# Patient Record
Sex: Female | Born: 1940 | Race: White | Hispanic: No | Smoking: Never smoker
Health system: Southern US, Community
[De-identification: ages and names within clinical notes are randomized; demographics above are authoritative.]

## PROBLEM LIST (undated history)

## (undated) DIAGNOSIS — E119 Type 2 diabetes mellitus without complications: Secondary | ICD-10-CM

## (undated) DIAGNOSIS — I10 Essential (primary) hypertension: Secondary | ICD-10-CM

## (undated) DIAGNOSIS — G459 Transient cerebral ischemic attack, unspecified: Secondary | ICD-10-CM

---

## 1999-03-24 ENCOUNTER — Other Ambulatory Visit: Admission: RE | Admit: 1999-03-24 | Discharge: 1999-03-24 | Payer: Self-pay | Admitting: Gynecology

## 1999-10-30 ENCOUNTER — Encounter: Payer: Self-pay | Admitting: Endocrinology

## 1999-10-30 ENCOUNTER — Ambulatory Visit (HOSPITAL_COMMUNITY): Admission: RE | Admit: 1999-10-30 | Discharge: 1999-10-30 | Payer: Self-pay | Admitting: Internal Medicine

## 2000-06-06 ENCOUNTER — Emergency Department (HOSPITAL_COMMUNITY): Admission: EM | Admit: 2000-06-06 | Discharge: 2000-06-06 | Payer: Self-pay | Admitting: Internal Medicine

## 2000-06-06 ENCOUNTER — Encounter: Payer: Self-pay | Admitting: Internal Medicine

## 2000-09-09 ENCOUNTER — Other Ambulatory Visit: Admission: RE | Admit: 2000-09-09 | Discharge: 2000-09-09 | Payer: Self-pay | Admitting: Gynecology

## 2001-11-22 ENCOUNTER — Other Ambulatory Visit: Admission: RE | Admit: 2001-11-22 | Discharge: 2001-11-22 | Payer: Self-pay | Admitting: Gynecology

## 2002-02-17 ENCOUNTER — Encounter: Admission: RE | Admit: 2002-02-17 | Discharge: 2002-02-17 | Payer: Self-pay | Admitting: Endocrinology

## 2002-02-17 ENCOUNTER — Encounter: Payer: Self-pay | Admitting: Endocrinology

## 2003-07-19 ENCOUNTER — Other Ambulatory Visit: Admission: RE | Admit: 2003-07-19 | Discharge: 2003-07-19 | Payer: Self-pay | Admitting: Gynecology

## 2004-07-21 ENCOUNTER — Other Ambulatory Visit: Admission: RE | Admit: 2004-07-21 | Discharge: 2004-07-21 | Payer: Self-pay | Admitting: Gynecology

## 2008-07-19 ENCOUNTER — Encounter: Admission: RE | Admit: 2008-07-19 | Discharge: 2008-07-19 | Payer: Self-pay | Admitting: Endocrinology

## 2010-03-17 ENCOUNTER — Encounter: Admission: RE | Admit: 2010-03-17 | Discharge: 2010-03-17 | Payer: Self-pay | Admitting: Gynecology

## 2010-04-04 ENCOUNTER — Ambulatory Visit (HOSPITAL_COMMUNITY)
Admission: RE | Admit: 2010-04-04 | Discharge: 2010-04-04 | Payer: Self-pay | Source: Home / Self Care | Admitting: Gynecology

## 2010-07-15 LAB — CBC
HCT: 36.7 % (ref 36.0–46.0)
Hemoglobin: 12.4 g/dL (ref 12.0–15.0)
MCH: 31.7 pg (ref 26.0–34.0)
MCHC: 33.8 g/dL (ref 30.0–36.0)
MCV: 93.8 fL (ref 78.0–100.0)
Platelets: 260 10*3/uL (ref 150–400)
RBC: 3.91 MIL/uL (ref 3.87–5.11)
RDW: 13.6 % (ref 11.5–15.5)
WBC: 6.4 10*3/uL (ref 4.0–10.5)

## 2010-07-15 LAB — BASIC METABOLIC PANEL
BUN: 11 mg/dL (ref 6–23)
Chloride: 101 mEq/L (ref 96–112)
Potassium: 4.4 mEq/L (ref 3.5–5.1)

## 2010-09-22 ENCOUNTER — Other Ambulatory Visit: Payer: Self-pay | Admitting: Endocrinology

## 2010-09-22 DIAGNOSIS — R1011 Right upper quadrant pain: Secondary | ICD-10-CM

## 2010-09-23 ENCOUNTER — Ambulatory Visit
Admission: RE | Admit: 2010-09-23 | Discharge: 2010-09-23 | Disposition: A | Discharge status: Home / Self Care | Payer: Medicare Other | Source: Ambulatory Visit | Attending: Endocrinology | Admitting: Endocrinology

## 2010-09-23 DIAGNOSIS — R1011 Right upper quadrant pain: Secondary | ICD-10-CM

## 2011-11-26 ENCOUNTER — Other Ambulatory Visit: Payer: Self-pay

## 2011-12-01 ENCOUNTER — Other Ambulatory Visit: Payer: Self-pay | Admitting: Gynecology

## 2011-12-01 DIAGNOSIS — R928 Other abnormal and inconclusive findings on diagnostic imaging of breast: Secondary | ICD-10-CM

## 2011-12-07 ENCOUNTER — Ambulatory Visit
Admission: RE | Admit: 2011-12-07 | Discharge: 2011-12-07 | Disposition: A | Discharge status: Home / Self Care | Payer: Medicare Other | Source: Ambulatory Visit | Attending: Gynecology | Admitting: Gynecology

## 2011-12-07 DIAGNOSIS — R928 Other abnormal and inconclusive findings on diagnostic imaging of breast: Secondary | ICD-10-CM

## 2012-01-11 ENCOUNTER — Other Ambulatory Visit: Payer: Self-pay | Admitting: Dermatology

## 2013-05-07 ENCOUNTER — Emergency Department (HOSPITAL_COMMUNITY)
Admission: EM | Admit: 2013-05-07 | Discharge: 2013-05-07 | Disposition: A | Discharge status: Home / Self Care | Payer: Medicare Other | Attending: Emergency Medicine | Admitting: Emergency Medicine

## 2013-05-07 ENCOUNTER — Emergency Department (HOSPITAL_COMMUNITY): Payer: Medicare Other

## 2013-05-07 ENCOUNTER — Encounter (HOSPITAL_COMMUNITY): Payer: Self-pay | Admitting: Emergency Medicine

## 2013-05-07 DIAGNOSIS — I10 Essential (primary) hypertension: Secondary | ICD-10-CM | POA: Insufficient documentation

## 2013-05-07 DIAGNOSIS — Y929 Unspecified place or not applicable: Secondary | ICD-10-CM | POA: Diagnosis not present

## 2013-05-07 DIAGNOSIS — S0993XA Unspecified injury of face, initial encounter: Secondary | ICD-10-CM | POA: Diagnosis present

## 2013-05-07 DIAGNOSIS — W010XXA Fall on same level from slipping, tripping and stumbling without subsequent striking against object, initial encounter: Secondary | ICD-10-CM | POA: Diagnosis not present

## 2013-05-07 DIAGNOSIS — S0003XA Contusion of scalp, initial encounter: Secondary | ICD-10-CM | POA: Diagnosis not present

## 2013-05-07 DIAGNOSIS — S1093XA Contusion of unspecified part of neck, initial encounter: Secondary | ICD-10-CM

## 2013-05-07 DIAGNOSIS — Y9301 Activity, walking, marching and hiking: Secondary | ICD-10-CM | POA: Diagnosis not present

## 2013-05-07 DIAGNOSIS — S43016A Anterior dislocation of unspecified humerus, initial encounter: Secondary | ICD-10-CM | POA: Diagnosis not present

## 2013-05-07 DIAGNOSIS — S43005A Unspecified dislocation of left shoulder joint, initial encounter: Secondary | ICD-10-CM

## 2013-05-07 DIAGNOSIS — S0083XA Contusion of other part of head, initial encounter: Secondary | ICD-10-CM

## 2013-05-07 DIAGNOSIS — Z8673 Personal history of transient ischemic attack (TIA), and cerebral infarction without residual deficits: Secondary | ICD-10-CM | POA: Diagnosis not present

## 2013-05-07 HISTORY — DX: Essential (primary) hypertension: I10

## 2013-05-07 HISTORY — DX: Transient cerebral ischemic attack, unspecified: G45.9

## 2013-05-07 MED ORDER — MORPHINE SULFATE 4 MG/ML IJ SOLN
4.0000 mg | Freq: Once | INTRAMUSCULAR | Status: AC
  Administered 2013-05-07: 4 mg via INTRAVENOUS
  Filled 2013-05-07: qty 1

## 2013-05-07 MED ORDER — HYDROCODONE-ACETAMINOPHEN 5-325 MG PO TABS: 1.0000 | ORAL_TABLET | ORAL | Status: DC | PRN

## 2013-05-07 MED ORDER — ONDANSETRON HCL 4 MG/2ML IJ SOLN
4.0000 mg | Freq: Once | INTRAMUSCULAR | Status: AC | PRN
  Administered 2013-05-07: 4 mg via INTRAVENOUS
  Filled 2013-05-07: qty 2

## 2013-05-07 MED ORDER — PROPOFOL 10 MG/ML IV BOLUS
INTRAVENOUS | Status: AC | PRN
  Administered 2013-05-07: 48.8 mg via INTRAVENOUS

## 2013-05-07 MED ORDER — PROPOFOL 10 MG/ML IV BOLUS
0.5000 mg/kg | INTRAVENOUS | Status: DC | PRN
  Filled 2013-05-07: qty 1

## 2013-05-07 MED ORDER — SODIUM CHLORIDE 0.9 % IV SOLN
Freq: Once | INTRAVENOUS | Status: AC
  Administered 2013-05-07: 19:00:00 via INTRAVENOUS

## 2013-05-07 NOTE — ED Notes (Signed)
Pt tripped while walking onto carpet.  Pt hit her head on the arm of the couch.  No loss of LOC.  Left eyebrow swelling with small abrasion to left cheek.  Left hand and left shoulder pain.  Denies any headache, neck or back pain.

## 2013-05-07 NOTE — ED Provider Notes (Signed)
CSN: [id number]     Arrival date & time 05/07/13  1525 History   First MD Initiated Contact with Patient 05/07/13 1537     Chief Complaint  Patient presents with  . Fall    HPI Pt was walking and tripped on a carpet falling onto her shoulder and head.   She is having pain primarily in her left shoulder.  She has a little bit of pain in her head but no where as severe as her left arm.  No LOC.  No vomting.  Still able to move fingers and toes.  Pain in her left upper arm is severe and increases with movement.  No anticoagulant medications.  Past Medical History  Diagnosis Date  . Hypertension   . TIA (transient ischemic attack)    No past surgical history on file. No family history on file. History  Substance Use Topics  . Smoking status: Never Smoker   . Smokeless tobacco: Not on file  . Alcohol Use: No   OB History   Grav Para Term Preterm Abortions TAB SAB Ect Mult Living                 Review of Systems  All other systems reviewed and are negative.    Allergies  Review of patient's allergies indicates no known allergies.  Home Medications   Current Outpatient Rx  Name  Route  Sig  Dispense  Refill  . escitalopram (LEXAPRO) 10 MG tablet   Oral   Take 10 mg by mouth daily.         Marland Kitchen ibuprofen (ADVIL,MOTRIN) 200 MG tablet   Oral   Take 200 mg by mouth every 6 (six) hours as needed for moderate pain.         Marland Kitchen LORazepam (ATIVAN) 1 MG tablet   Oral   Take 1 mg by mouth every 8 (eight) hours.         Marland Kitchen olmesartan-hydrochlorothiazide (BENICAR HCT) 40-25 MG per tablet   Oral   Take 1 tablet by mouth daily.         Marland Kitchen HYDROcodone-acetaminophen (NORCO/VICODIN) 5-325 MG per tablet   Oral   Take 1-2 tablets by mouth every 4 (four) hours as needed.   30 tablet   0    BP 150/79  Pulse 82  Temp(Src) 97.9 F (36.6 C) (Oral)  Resp 15  Ht 5\' 6"  (1.676 m)  Wt 215 lb (97.523 kg)  BMI 34.72 kg/m2  SpO2 98% Physical Exam  Nursing note and vitals  reviewed. Constitutional: She appears well-developed and well-nourished. No distress.  HENT:  Head: Normocephalic.  Right Ear: External ear normal.  Left Ear: External ear normal.  Hematoma above right eyebrow, small abrasion as well  Eyes: Conjunctivae are normal. Right eye exhibits no discharge. Left eye exhibits no discharge. No scleral icterus.  No subconjunctival hemorrhage, normal pupils, no hyphema  Neck: Neck supple. No tracheal deviation present.  Cardiovascular: Normal rate, regular rhythm and intact distal pulses.   Pulmonary/Chest: Effort normal and breath sounds normal. No stridor. No respiratory distress. She has no wheezes. She has no rales.  Abdominal: Soft. Bowel sounds are normal. She exhibits no distension. There is no tenderness. There is no rebound and no guarding.  Musculoskeletal: She exhibits no edema.       Left shoulder: She exhibits decreased range of motion, tenderness and bony tenderness. She exhibits normal pulse and normal strength.       Left elbow: She exhibits normal  range of motion, no swelling, no effusion, no deformity and no laceration. Tenderness (mild) found.  Neurological: She is alert. She has normal strength. No sensory deficit. Cranial nerve deficit:  no gross defecits noted. She exhibits normal muscle tone. She displays no seizure activity. Coordination normal.  Skin: Skin is warm and dry. No rash noted.  Psychiatric: She has a normal mood and affect.    ED Course  Procedural sedation Date/Time: 05/07/2013 7:43 PM Performed by: Linwood DibblesKNAPP, Terique Kawabata R Authorized by: Linwood DibblesKNAPP, Jadene Stemmer R Consent: Verbal consent obtained. Risks and benefits: risks, benefits and alternatives were discussed Consent given by: patient Required items: required blood products, implants, devices, and special equipment available Preparation: Patient was prepped and draped in the usual sterile fashion. Local anesthesia used: no Patient sedated: yes Sedatives: propofol Vitals: Vital  signs were monitored during sedation. Comments: See nursing notes for start and stop times.  Total time approximately 20 minutes  Reduction of dislocation Date/Time: 05/07/2013 7:53 PM Performed by: Linwood DibblesKNAPP, Murry Khiev R Authorized by: Linwood DibblesKNAPP, Zhaniya Swallows R Consent: Verbal consent obtained. Risks and benefits: risks, benefits and alternatives were discussed Time out: Immediately prior to procedure a "time out" was called to verify the correct patient, procedure, equipment, support staff and site/side marked as required. Patient sedated: yes Patient tolerance: Patient tolerated the procedure well with no immediate complications. Comments: Traction counter traction.   Post reduction films with successful reduction   (including critical care time) 1730  Attempted cunningham technique without relief.  Will proceed with sedation and reduction. Labs Review Labs Reviewed - No data to display Imaging Review Dg Elbow 2 Views Left  05/07/2013   CLINICAL DATA:  Larey SeatFell.  Left elbow pain.  EXAM: LEFT ELBOW - 2 VIEW  COMPARISON:  None.  FINDINGS: Limited two-view elbow does not demonstrate any definite acute fracture or joint effusion.  IMPRESSION: Limited examination without obvious fracture or effusion.   Electronically Signed   By: Loralie ChampagneMark  Gallerani M.D.   On: 05/07/2013 16:51   Ct Head Wo Contrast  05/07/2013   CLINICAL DATA:  Larey SeatFell hit head, left eyebrow swelling  EXAM: CT HEAD WITHOUT CONTRAST  TECHNIQUE: Contiguous axial images were obtained from the base of the skull through the vertex without intravenous contrast.  COMPARISON:  07/19/2008  FINDINGS: Left frontal scalp hematoma. No skull fracture. No hemorrhage, infarct, or extra-axial fluid. No hydrocephalus. 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 It is stable.  IMPRESSION: No acute intracranial abnormalities.  Left scalp hematoma.   Electronically Signed   By: Esperanza Heiraymond  Rubner M.D.   On: 05/07/2013 16:57   Dg  Shoulder Left  05/07/2013   CLINICAL DATA:  Larey SeatFell.  Injured shoulder.  EXAM: LEFT SHOULDER - 2+ VIEW  COMPARISON:  None.  FINDINGS: There is an anterior dislocation of the humeral head. No acute fracture. [facility] demonstrates moderate degenerative changes and there is a large per projecting off the undersurface of the acromion. The left lung apex is clear.  IMPRESSION: Anterior dislocation of the humeral head.   Electronically Signed   By: Loralie ChampagneMark  Gallerani M.D.   On: 05/07/2013 16:49   Dg Shoulder Left Port  05/07/2013   CLINICAL DATA:  Status post left shoulder reduction  EXAM: PORTABLE LEFT SHOULDER - 2+ VIEW  COMPARISON:  None.  FINDINGS: There is no shoulder fracture or dislocation. There are degenerative changes of the acromioclavicular joint. There are mild degenerative changes of the left glenohumeral joint.  IMPRESSION:  No acute osseous injury of the left shoulder.   Electronically Signed   By: Elige Ko   On: 05/07/2013 19:10    Medications  propofol (DIPRIVAN) 10 mg/mL bolus/IV push 48.8 mg (not administered)  morphine 4 MG/ML injection 4 mg (4 mg Intravenous Given 05/07/13 1613)  ondansetron (ZOFRAN) injection 4 mg (4 mg Intravenous Given 05/07/13 1609)  morphine 4 MG/ML injection 4 mg (4 mg Intravenous Given 05/07/13 1805)  propofol (DIPRIVAN) 10 mg/mL bolus/IV push ( Intravenous Stopped 05/07/13 1846)  0.9 %  sodium chloride infusion ( Intravenous Stopped 05/07/13 1946)    MDM   1. Facial contusion, initial encounter   2. Shoulder dislocation, left, initial encounter    Pt tolerated well.  No sign of serious injury.  Will dc home with pain meds, ortho follow up.  Shoulder placed in a sling.   Celene Kras, MD 05/07/13 [phone]

## 2013-05-07 NOTE — Discharge Instructions (Signed)
Shoulder Dislocation ° Shoulder dislocation is when your upper arm bone (humerus) is forced out of your shoulder joint. Your doctor will put your shoulder back into the joint by pulling on your arm or through surgery. Your arm will be placed in a shoulder immobilizer or sling. The shoulder immobilizer or sling holds your shoulder in place while it heals. °HOME CARE  °· Rest your injured joint. Do not move it until instructed to do so. °· Put ice on your injured joint as told by your doctor. °· Put ice in a plastic bag. °· Place a towel between your skin and the bag. °· Leave the ice on for 15-20 minutes at a time, every 2 hours while you are awake. °· Only take medicines as told by your doctor. °· Squeeze a ball to exercise your hand. °GET HELP RIGHT AWAY IF:  °· Your splint or sling becomes damaged. °· Your pain becomes worse, not better. °· You lose feeling in your arm or hand. °· Your arm or hand becomes white or cold. °MAKE SURE YOU:  °· Understand these instructions. °· Will watch your condition. °· Will get help right away if you are not doing well or get worse. °Document Released: 07/13/2011 Document Reviewed: 07/13/2011 °[employer]® Patient Information ©2014 [employer]. ° °

## 2013-05-07 NOTE — ED Notes (Signed)
Report received from Susan, RN

## 2013-05-07 NOTE — ED Notes (Signed)
Ortho tech called r/t sling

## 2013-05-07 NOTE — ED Notes (Signed)
Ortho tech at bedside 

## 2013-05-07 NOTE — ED Notes (Signed)
Patient calling family member for a ride home.

## 2013-05-07 NOTE — ED Notes (Signed)
Bed: [id number] Expected date: 05/07/13 Expected time: 3:11 PM Means of arrival:  Comments: EMS- 73 y/o fall

## 2013-05-07 NOTE — ED Notes (Signed)
Pt awakens and follows commands appropriately. Pt is A&O x4, in NAD. Pt states that she just wants to sleep. VSS

## 2013-06-13 ENCOUNTER — Observation Stay (HOSPITAL_COMMUNITY)
Admission: EM | Admit: 2013-06-13 | Discharge: 2013-06-15 | Disposition: A | Discharge status: Home / Self Care | Payer: Medicare Other | Attending: Internal Medicine | Admitting: Internal Medicine

## 2013-06-13 ENCOUNTER — Emergency Department (HOSPITAL_COMMUNITY): Payer: Medicare Other

## 2013-06-13 ENCOUNTER — Encounter (HOSPITAL_COMMUNITY): Payer: Self-pay | Admitting: Emergency Medicine

## 2013-06-13 DIAGNOSIS — R918 Other nonspecific abnormal finding of lung field: Secondary | ICD-10-CM | POA: Diagnosis present

## 2013-06-13 DIAGNOSIS — Z8673 Personal history of transient ischemic attack (TIA), and cerebral infarction without residual deficits: Secondary | ICD-10-CM | POA: Insufficient documentation

## 2013-06-13 DIAGNOSIS — I10 Essential (primary) hypertension: Secondary | ICD-10-CM | POA: Insufficient documentation

## 2013-06-13 DIAGNOSIS — I309 Acute pericarditis, unspecified: Secondary | ICD-10-CM | POA: Diagnosis present

## 2013-06-13 DIAGNOSIS — Z8249 Family history of ischemic heart disease and other diseases of the circulatory system: Secondary | ICD-10-CM | POA: Insufficient documentation

## 2013-06-13 DIAGNOSIS — Z79899 Other long term (current) drug therapy: Secondary | ICD-10-CM | POA: Insufficient documentation

## 2013-06-13 DIAGNOSIS — R079 Chest pain, unspecified: Secondary | ICD-10-CM | POA: Diagnosis present

## 2013-06-13 DIAGNOSIS — Z7982 Long term (current) use of aspirin: Secondary | ICD-10-CM | POA: Insufficient documentation

## 2013-06-13 DIAGNOSIS — I319 Disease of pericardium, unspecified: Principal | ICD-10-CM | POA: Insufficient documentation

## 2013-06-13 DIAGNOSIS — I517 Cardiomegaly: Secondary | ICD-10-CM | POA: Diagnosis present

## 2013-06-13 DIAGNOSIS — R0600 Dyspnea, unspecified: Secondary | ICD-10-CM

## 2013-06-13 DIAGNOSIS — R0602 Shortness of breath: Secondary | ICD-10-CM | POA: Insufficient documentation

## 2013-06-13 LAB — CBC WITH DIFFERENTIAL/PLATELET
BASOS ABS: 0 10*3/uL (ref 0.0–0.1)
Basophils Relative: 0 % (ref 0–1)
EOS ABS: 0 10*3/uL (ref 0.0–0.7)
Eosinophils Relative: 0 % (ref 0–5)
HEMATOCRIT: 36.1 % (ref 36.0–46.0)
Hemoglobin: 11.6 g/dL — ABNORMAL LOW (ref 12.0–15.0)
Lymphocytes Relative: 15 % (ref 12–46)
Lymphs Abs: 1.2 10*3/uL (ref 0.7–4.0)
MCH: 29.6 pg (ref 26.0–34.0)
MCHC: 32.1 g/dL (ref 30.0–36.0)
MCV: 92.1 fL (ref 78.0–100.0)
MONO ABS: 0.7 10*3/uL (ref 0.1–1.0)
Monocytes Relative: 9 % (ref 3–12)
Neutro Abs: 5.9 10*3/uL (ref 1.7–7.7)
Neutrophils Relative %: 76 % (ref 43–77)
PLATELETS: 218 10*3/uL (ref 150–400)
RBC: 3.92 MIL/uL (ref 3.87–5.11)
RDW: 13.5 % (ref 11.5–15.5)
WBC: 7.8 10*3/uL (ref 4.0–10.5)

## 2013-06-13 LAB — URINALYSIS, ROUTINE W REFLEX MICROSCOPIC
Bilirubin Urine: NEGATIVE
GLUCOSE, UA: NEGATIVE mg/dL
Hgb urine dipstick: NEGATIVE
KETONES UR: NEGATIVE mg/dL
Nitrite: NEGATIVE
PH: 6 (ref 5.0–8.0)
PROTEIN: NEGATIVE mg/dL
Specific Gravity, Urine: 1.015 (ref 1.005–1.030)
Urobilinogen, UA: 1 mg/dL (ref 0.0–1.0)

## 2013-06-13 LAB — BASIC METABOLIC PANEL
BUN: 11 mg/dL (ref 6–23)
CO2: 28 mEq/L (ref 19–32)
Calcium: 8.8 mg/dL (ref 8.4–10.5)
Chloride: 99 mEq/L (ref 96–112)
Creatinine, Ser: 0.56 mg/dL (ref 0.50–1.10)
GFR calc Af Amer: >90 mL/min (ref >90)
Glucose, Bld: 142 mg/dL — ABNORMAL HIGH (ref 70–99)
Potassium: 3.4 mEq/L — ABNORMAL LOW (ref 3.7–5.3)
SODIUM: 138 meq/L (ref 137–147)

## 2013-06-13 LAB — URINE MICROSCOPIC-ADD ON

## 2013-06-13 LAB — POCT I-STAT TROPONIN I: Troponin i, poc: 0 ng/mL (ref 0.00–0.08)

## 2013-06-13 LAB — D-DIMER, QUANTITATIVE (NOT AT ARMC): D DIMER QUANT: 1.1 ug{FEU}/mL — AB (ref 0.00–0.48)

## 2013-06-13 MED ORDER — ASPIRIN 81 MG PO CHEW
324.0000 mg | CHEWABLE_TABLET | Freq: Once | ORAL | Status: DC
  Filled 2013-06-13: qty 4

## 2013-06-13 MED ORDER — SODIUM CHLORIDE 0.9 % IV BOLUS (SEPSIS)
1000.0000 mL | Freq: Once | INTRAVENOUS | Status: AC
  Administered 2013-06-13: 1000 mL via INTRAVENOUS

## 2013-06-13 MED ORDER — IOHEXOL 350 MG/ML SOLN
100.0000 mL | Freq: Once | INTRAVENOUS | Status: AC | PRN
  Administered 2013-06-13: 100 mL via INTRAVENOUS

## 2013-06-13 MED ORDER — NITROGLYCERIN 0.4 MG SL SUBL
0.4000 mg | SUBLINGUAL_TABLET | SUBLINGUAL | Status: AC | PRN
  Administered 2013-06-13 (×3): 0.4 mg via SUBLINGUAL
  Filled 2013-06-13 (×2): qty 25

## 2013-06-13 NOTE — ED Provider Notes (Signed)
TIME SEEN: 8:15 PM  CHIEF COMPLAINT: Chest pain, shortness of breath  HPI: Patient is a 73 yo female with a history of hypertension, prior TIA who presents to the emergency department with chest tightness and shortness of breath that started at 2 PM today.   She reports the pain is worse with exertion and deep inspiration but also with leaning forward. She's had nausea but no dizziness or diaphoresis. She does have a significant family history of mother and sister who have both have had heart attacks. She reports recently to sleep she has not been feeling well with fevers and nausea and body aches. No prior history of stress test. No prior history of cardiac disease or CHF. No prior history of PE or DVT, no lower extremity swelling or pain.   ROS: See HPI Constitutional: no fever  Eyes: no drainage  ENT: no runny nose   Cardiovascular:  no chest pain  Resp: no SOB  GI: no vomiting GU: no dysuria Integumentary: no rash  Allergy: no hives  Musculoskeletal: no leg swelling  Neurological: no slurred speech ROS otherwise negative  PAST MEDICAL HISTORY/PAST SURGICAL HISTORY:  Past Medical History  Diagnosis Date  . Hypertension   . TIA (transient ischemic attack)     MEDICATIONS:  Prior to Admission medications   Medication Sig Start Date End Date Taking? Authorizing Provider  escitalopram (LEXAPRO) 10 MG tablet Take 10 mg by mouth daily.   Yes Historical Provider, MD  GARLIC PO Take 2 capsules by mouth daily.   Yes Historical Provider, MD  ibuprofen (ADVIL,MOTRIN) 200 MG tablet Take 200 mg by mouth every 6 (six) hours as needed for moderate pain.   Yes Historical Provider, MD  LORazepam (ATIVAN) 1 MG tablet Take 1 mg by mouth 2 (two) times daily as needed for anxiety.    Yes Historical Provider, MD  Multiple Vitamin (MULTIVITAMIN WITH MINERALS) TABS tablet Take 1 tablet by mouth daily.   Yes Historical Provider, MD  olmesartan-hydrochlorothiazide (BENICAR HCT) 40-25 MG per tablet Take 1  tablet by mouth daily.   Yes Historical Provider, MD    ALLERGIES:  No Known Allergies  SOCIAL HISTORY:  History  Substance Use Topics  . Smoking status: Never Smoker   . Smokeless tobacco: Not on file  . Alcohol Use: No    FAMILY HISTORY: No family history on file.  EXAM: BP 156/74  Pulse 92  Temp(Src) 98.7 F (37.1 C) (Oral)  Resp 18  SpO2 96% CONSTITUTIONAL: Alert and oriented and responds appropriately to questions. Well-appearing; well-nourished HEAD: Normocephalic EYES: Conjunctivae clear, PERRL ENT: normal nose; no rhinorrhea; moist mucous membranes; pharynx without lesions noted NECK: Supple, no meningismus, no LAD  CARD: RRR; S1 and S2 appreciated; no murmurs, no clicks, no rubs, no gallops RESP: Normal chest excursion without splinting or tachypnea; breath sounds clear and equal bilaterally; no wheezes, no rhonchi, no rales,  ABD/GI: Normal bowel sounds; non-distended; soft, non-tender, no rebound, no guarding BACK:  The back appears normal and is non-tender to palpation, there is no CVA tenderness EXT: Normal ROM in all joints; non-tender to palpation; no edema; normal capillary refill; no cyanosis    SKIN: Normal color for age and race; warm NEURO: Moves all extremities equally PSYCH: The patient's mood and manner are appropriate. Grooming and personal hygiene are appropriate.  MEDICAL DECISION MAKING:  patient here with chest pain, shortness of breath. We'll obtain cardiac labs, d-dimer, chest x-ray. Will give aspirin nitroglycerin. I feel patient will likely need  admission for rule out. Her PCP is Dr. Adela Lank with [facility].   ED PROGRESS: Patient's labs are unremarkable. Troponin negative. Her d-dimer however is elevated at 1.10. Chest x-ray shows no infiltrate, edema, pneumothorax or effusion. We'll obtain CT of patient's chest to rule out pulmonary embolus.   11:30 PM  Pt CT scan shows no pulmonary embolus or aortic abnormality. There are  scattered pulmonary nodules. Have discussed with patient that she will need to have this followed by her primary care physician. Have discussed with hospitalist for admission for ACS rule out.   EKG Interpretation    Date/Time:  Tuesday June 13 2013 19:49:58 EST Ventricular Rate:  94 PR Interval:  163 QRS Duration: 106 QT Interval:  361 QTC Calculation: 451 R Axis:   78 Text Interpretation:  Sinus rhythm Confirmed by Emmogene Simson  DO, Masami Plata ([id number]) on 06/13/2013 9:08:09 PM             Layla Maw Sibbie Flammia, DO 06/13/13 2331

## 2013-06-13 NOTE — ED Notes (Signed)
Pt states she has had 7/10 chest pain and slight SOB since 2PM today. She states she feels pressure in her chest and throat. Pt states she has felt sick since yesterday and was running a slight fever. Pt states that when she bends over it "feels like my insides are coming out through my esophagus". Pt denies dizziness

## 2013-06-14 DIAGNOSIS — R079 Chest pain, unspecified: Secondary | ICD-10-CM

## 2013-06-14 DIAGNOSIS — I10 Essential (primary) hypertension: Secondary | ICD-10-CM

## 2013-06-14 DIAGNOSIS — I369 Nonrheumatic tricuspid valve disorder, unspecified: Secondary | ICD-10-CM

## 2013-06-14 DIAGNOSIS — I309 Acute pericarditis, unspecified: Secondary | ICD-10-CM | POA: Diagnosis present

## 2013-06-14 DIAGNOSIS — R0609 Other forms of dyspnea: Secondary | ICD-10-CM

## 2013-06-14 DIAGNOSIS — R0989 Other specified symptoms and signs involving the circulatory and respiratory systems: Secondary | ICD-10-CM

## 2013-06-14 LAB — TROPONIN I
Troponin I: <0.3 ng/mL (ref <0.30)
Troponin I: <0.3 ng/mL (ref <0.30)
Troponin I: <0.3 ng/mL (ref <0.30)

## 2013-06-14 MED ORDER — NITROGLYCERIN 0.4 MG SL SUBL: 0.4000 mg | SUBLINGUAL_TABLET | SUBLINGUAL | Status: DC | PRN

## 2013-06-14 MED ORDER — METOPROLOL TARTRATE 25 MG PO TABS
25.0000 mg | ORAL_TABLET | Freq: Two times a day (BID) | ORAL | Status: DC
  Administered 2013-06-14 – 2013-06-15 (×2): 25 mg via ORAL
  Filled 2013-06-14 (×3): qty 1

## 2013-06-14 MED ORDER — PANTOPRAZOLE SODIUM 40 MG IV SOLR
40.0000 mg | INTRAVENOUS | Status: DC
  Administered 2013-06-14 – 2013-06-15 (×2): 40 mg via INTRAVENOUS
  Filled 2013-06-14 (×3): qty 40

## 2013-06-14 MED ORDER — ASPIRIN EC 325 MG PO TBEC
325.0000 mg | DELAYED_RELEASE_TABLET | Freq: Every day | ORAL | Status: DC
  Administered 2013-06-14 – 2013-06-15 (×2): 325 mg via ORAL
  Filled 2013-06-14 (×2): qty 1

## 2013-06-14 MED ORDER — LORAZEPAM 1 MG PO TABS
1.0000 mg | ORAL_TABLET | Freq: Two times a day (BID) | ORAL | Status: DC | PRN
  Administered 2013-06-14 – 2013-06-15 (×2): 1 mg via ORAL
  Filled 2013-06-14 (×2): qty 1

## 2013-06-14 MED ORDER — ONDANSETRON HCL 4 MG/2ML IJ SOLN: 4.0000 mg | Freq: Four times a day (QID) | INTRAMUSCULAR | Status: DC | PRN

## 2013-06-14 MED ORDER — POTASSIUM CHLORIDE CRYS ER 20 MEQ PO TBCR
EXTENDED_RELEASE_TABLET | ORAL | Status: AC
  Filled 2013-06-14: qty 2

## 2013-06-14 MED ORDER — HYDROCHLOROTHIAZIDE 25 MG PO TABS
25.0000 mg | ORAL_TABLET | Freq: Every day | ORAL | Status: DC
  Administered 2013-06-14 – 2013-06-15 (×2): 25 mg via ORAL
  Filled 2013-06-14 (×2): qty 1

## 2013-06-14 MED ORDER — ENOXAPARIN SODIUM 40 MG/0.4ML ~~LOC~~ SOLN
40.0000 mg | SUBCUTANEOUS | Status: DC
  Administered 2013-06-14 – 2013-06-15 (×2): 40 mg via SUBCUTANEOUS
  Filled 2013-06-14 (×2): qty 0.4

## 2013-06-14 MED ORDER — OLMESARTAN MEDOXOMIL-HCTZ 40-25 MG PO TABS: 1.0000 | ORAL_TABLET | Freq: Every day | ORAL | Status: DC

## 2013-06-14 MED ORDER — ACETAMINOPHEN 325 MG PO TABS
650.0000 mg | ORAL_TABLET | ORAL | Status: DC | PRN
  Administered 2013-06-14: 650 mg via ORAL
  Filled 2013-06-14: qty 2

## 2013-06-14 MED ORDER — POTASSIUM CHLORIDE CRYS ER 20 MEQ PO TBCR
30.0000 meq | EXTENDED_RELEASE_TABLET | Freq: Once | ORAL | Status: AC
  Administered 2013-06-14: 30 meq via ORAL

## 2013-06-14 MED ORDER — COLCHICINE 0.6 MG PO TABS
0.6000 mg | ORAL_TABLET | Freq: Two times a day (BID) | ORAL | Status: DC
  Administered 2013-06-14 – 2013-06-15 (×4): 0.6 mg via ORAL
  Filled 2013-06-14 (×5): qty 1

## 2013-06-14 MED ORDER — ESCITALOPRAM OXALATE 10 MG PO TABS
10.0000 mg | ORAL_TABLET | Freq: Every day | ORAL | Status: DC
  Administered 2013-06-14 – 2013-06-15 (×2): 10 mg via ORAL
  Filled 2013-06-14 (×2): qty 1

## 2013-06-14 MED ORDER — IRBESARTAN 300 MG PO TABS
300.0000 mg | ORAL_TABLET | Freq: Every day | ORAL | Status: DC
  Administered 2013-06-14 – 2013-06-15 (×2): 300 mg via ORAL
  Filled 2013-06-14 (×2): qty 1

## 2013-06-14 MED ORDER — NITROGLYCERIN 2 % TD OINT
0.5000 [in_us] | TOPICAL_OINTMENT | Freq: Four times a day (QID) | TRANSDERMAL | Status: DC
  Administered 2013-06-14 – 2013-06-15 (×7): 0.5 [in_us] via TOPICAL
  Filled 2013-06-14: qty 30

## 2013-06-14 NOTE — Progress Notes (Signed)
[facility] PROGRESS NOTE  Melinda Mann GNF:[id number] DOB: 12-Sep-1940 DOA: 06/13/2013 PCP: Michiel SitesKOHUT,WALTER DENNIS, MD  Assessment/Plan:  Chest pain  -Obtain troponin x3 negative -ProBNP -Echocardiogram pending - EKG does show diffuse PR segment depression and with her symptoms of worsening of her pain with leaning forward is highly suggestive of pericarditis.  -Continue aspirin 325 mg daily.  Pericarditis  -I will place her on colchicine 0.6 twice a day and once she is ruled out for ACS recommend to place her on Indocin ibuprofen.   Hypertension -Not within [facility] guidelines Start metoprolol 25 mg BID Continue her home antihypertensive medications blood pressure stable at present    Code Status: Full Family Communication: No family available  Disposition Plan: Completion workup chest pain   Consultants:    Procedures:   Antibiotics:    HPI/Subjective: Melinda Mann is a 73 y.o. female with Past medical history of hypertension and TIA.  The patient is coming from home.  The patient presented with complaints of chest pain that is ongoing since this evening. She mentions the pain was initially associated with exertion and was worsening with exertion. The pain was pressure like sensation located substernally and worsening with leaning forward. She also had worsening of her pain with deep inspiration he does she had some shortness of breath on exertion which is resolved at present. She denies any fever or recent upper respiratory infection. She has family history of coronary artery disease. She denies any prior coronary work up. She denies any similar symptoms in the past. No recent travel or immobilization or leg swelling. Although she complains of some constitutional symptoms of generalized bodyache that has been ongoing since last week or so.  She is compliant with her medication and there is no change in her medication she has taken a total dose of aspirin prior to her  arrival to ED. Her pain was completely resolved with nitroglycerin.      Objective: Filed Vitals:   06/13/13 2315 06/14/13 0120 06/14/13 0507 06/14/13 1356  BP: 148/68 147/61 149/62 174/70  Pulse: 94 86 82 85  Temp:  98.9 F (37.2 C) 98.9 F (37.2 C) 98.1 F (36.7 C)  TempSrc:  Oral Oral Oral  Resp: 19 20 18 20   Height:  5\' 6"  (1.676 m)    Weight:  97.9 kg (215 lb 13.3 oz)    SpO2: 93% 97% 97% 96%    Intake/Output Summary (Last 24 hours) at 06/14/13 1523 Last data filed at 06/14/13 1300  Gross per 24 hour  Intake    360 ml  Output      0 ml  Net    360 ml   Filed Weights   06/14/13 0120  Weight: 97.9 kg (215 lb 13.3 oz)    Exam:   General:  A./O. x4, mild residual CP reported as 4/10 tightness  Cardiovascular: Regular rhythm and rate, negative murmurs rubs or gallops, DP/PT pulse plus one  Respiratory: Good auscultation by the  Abdomen: Soft, nontender, nondistended, positive bowel sounds  Musculoskeletal: Negative pedal edema   Data Reviewed: Basic Metabolic Panel:  Recent Labs Lab 06/13/13 2015  NA 138  K 3.4*  CL 99  CO2 28  GLUCOSE 142*  BUN 11  CREATININE 0.56  CALCIUM 8.8   Liver Function Tests: No results found for this basename: AST, ALT, ALKPHOS, BILITOT, PROT, ALBUMIN,  in the last 168 hours No results found for this basename: LIPASE, AMYLASE,  in the last 168 hours No results  found for this basename: AMMONIA,  in the last 168 hours CBC:  Recent Labs Lab 06/13/13 2015  WBC 7.8  NEUTROABS 5.9  HGB 11.6*  HCT 36.1  MCV 92.1  PLT 218   Cardiac Enzymes:  Recent Labs Lab 06/14/13 0240 06/14/13 0822 06/14/13 1438  TROPONINI <0.30 <0.30 <0.30   BNP (last 3 results) No results found for this basename: PROBNP,  in the last 8760 hours CBG: No results found for this basename: GLUCAP,  in the last 168 hours  No results found for this or any previous visit (from the past 240 hour(s)).   Studies: Dg Chest 2 View  06/13/2013    CLINICAL DATA:  Chest pain, shortness of breath  EXAM: CHEST  2 VIEW  COMPARISON:  February 18, 2010  FINDINGS: The heart size and mediastinal contours are stable. The aorta is tortuous. There is no focal infiltrate, pulmonary edema, or pleural effusion. The visualized skeletal structures are stable.  IMPRESSION: No active cardiopulmonary disease.   Electronically Signed   By: Sherian Rein M.D.   On: 06/13/2013 20:40   Ct Angio Chest Pe W/cm &/or Wo Cm  06/13/2013   CLINICAL DATA:  Chest fullness with inspiration. Pulmonary embolism.  EXAM: CT ANGIOGRAPHY CHEST WITH CONTRAST  TECHNIQUE: Multidetector CT imaging of the chest was performed using the standard protocol during bolus administration of intravenous contrast. Multiplanar CT image reconstructions and MIPs were obtained to evaluate the vascular anatomy.  CONTRAST:  OMNIPAQUE IOHEXOL 350 MG/ML SOLN  COMPARISON:  None.  FINDINGS: Technically adequate study for evaluation of pulmonary embolus. No pulmonary embolism is present. Three vessel aortic arch is present with tortuous branch vessels. There is no axillary adenopathy. No mediastinal adenopathy. Mediastinal lipomatosis is present. Incidental imaging the upper abdomen demonstrates low attenuation of the liver suggesting hepatosteatosis. 6 mm low-density lesion in the right hepatic dome likely represents cysts. No aggressive osseous lesions are identified. No effusion.  The lung windows demonstrate a 6 mm ground-glass attenuation nodule at the right apex. 4 mm pulmonary nodule is present in the medial right upper lobe.  Old granulomatous calcification is present in the anterior right hepatic dome over the hemidiaphragm. Dependent atelectasis in the lungs. 4 mm calcified granuloma in the posterior left lower lobe. Calcified AP window lymph nodes incidentally noted. 2 mm subpleural pulmonary nodule in the superior segment of the left lower lobe.  Review of the MIP images confirms the above findings.   IMPRESSION: 1. Negative for pulmonary embolus or acute aortic abnormality. 2. Old granulomatous disease. 3. Scattered pulmonary nodules. Medial right upper lobe 4 mm pulmonary nodule (image 22 series 11). If the patient is at high risk for bronchogenic carcinoma, follow-up chest CT at 1 year is recommended. If the patient is at low risk, no follow-up is needed. This recommendation follows the consensus statement: Guidelines for Management of Small Pulmonary Nodules Detected on CT Scans: A Statement from the Fleischner Society as published in Radiology 2005; 237:395-400.   Electronically Signed   By: Andreas Newport M.D.   On: 06/13/2013 22:28    Scheduled Meds: . aspirin  324 mg Oral Once  . aspirin EC  325 mg Oral Daily  . colchicine  0.6 mg Oral BID  . enoxaparin (LOVENOX) injection  40 mg Subcutaneous Q24H  . escitalopram  10 mg Oral Daily  . hydrochlorothiazide  25 mg Oral Daily  . irbesartan  300 mg Oral Daily  . nitroGLYCERIN  0.5 inch Topical 4 times per  day  . pantoprazole (PROTONIX) IV  40 mg Intravenous Q24H   Continuous Infusions:   Principal Problem:   Chest pain Active Problems:   Hypertension   Possible Acute pericarditis, unspecified    Time spent: 40 minutes    WOODS, CURTIS, J  [facility] Pager [phone]. If 7PM-7AM, please contact night-coverage at [email or web address], password [facility] 06/14/2013, 3:23 PM  LOS: 1 day

## 2013-06-14 NOTE — Progress Notes (Signed)
  Echocardiogram 2D Echocardiogram has been performed.  Cathie BeamsGREGORY, Brodey Bonn 06/14/2013, 10:54 AM

## 2013-06-14 NOTE — H&P (Signed)
[facility] History and Physical  Patient: Melinda Mann  ZOX:[id number]  DOB: [birth year]  DOS: the patient was seen and examined on 06/14/2013 PCP: Michiel Sites, MD  Chief Complaint: Chest pain  HPI: Melinda Mann is a 73 y.o. female with Past medical history of hypertension and TIA. The patient is coming from home. The patient presented with complaints of chest pain that is ongoing since this evening. She mentions the pain was initially associated with exertion and was worsening with exertion. The pain was pressure like sensation located substernally and worsening with leaning forward. She also had worsening of her pain with deep inspiration he does she had some shortness of breath on exertion which is resolved at present. She denies any fever or recent upper respiratory infection. She has family history of coronary artery disease. She denies any prior coronary work up. She denies any similar symptoms in the past. No recent travel or immobilization or leg swelling. Although she complains of some constitutional symptoms of generalized bodyache that has been ongoing since last week or so. She is compliant with her medication and there is no change in her medication she has taken a total dose of aspirin prior to her arrival to ED. Her pain was completely resolved with nitroglycerin.  Review of Systems: as mentioned in the history of present illness.  A Comprehensive review of the other systems is negative.  Past Medical History  Diagnosis Date  . Hypertension   . TIA (transient ischemic attack)    History reviewed. No pertinent past surgical history. Social History:  reports that she has never smoked. She does not have any smokeless tobacco history on file. She reports that she does not drink alcohol or use illicit drugs. Independent for most of her  ADL.  No Known Allergies  No family history on file.  Prior to Admission medications   Medication Sig Start Date End Date  Taking? Authorizing Provider  escitalopram (LEXAPRO) 10 MG tablet Take 10 mg by mouth daily.   Yes Historical Provider, MD  GARLIC PO Take 2 capsules by mouth daily.   Yes Historical Provider, MD  ibuprofen (ADVIL,MOTRIN) 200 MG tablet Take 200 mg by mouth every 6 (six) hours as needed for moderate pain.   Yes Historical Provider, MD  LORazepam (ATIVAN) 1 MG tablet Take 1 mg by mouth 2 (two) times daily as needed for anxiety.    Yes Historical Provider, MD  Multiple Vitamin (MULTIVITAMIN WITH MINERALS) TABS tablet Take 1 tablet by mouth daily.   Yes Historical Provider, MD  olmesartan-hydrochlorothiazide (BENICAR HCT) 40-25 MG per tablet Take 1 tablet by mouth daily.   Yes Historical Provider, MD    Physical Exam: Filed Vitals:   06/13/13 2230 06/13/13 2300 06/13/13 2310 06/13/13 2315  BP: 167/75 167/73 159/70 148/68  Pulse: 90 89 93 94  Temp:      TempSrc:      Resp: 19 22 24 19   SpO2: 96% 95% 93% 93%    General: Alert, Awake and Oriented to Time, Place and Person. Appear in moderate distress Eyes: PERRL ENT: Oral Mucosa clear moist. Neck: No JVD Cardiovascular: S1 and S2 Present, no Murmur, Peripheral Pulses Present Respiratory: Bilateral Air entry equal and Decreased, Clear to Auscultation,  No Crackles, no wheezes Abdomen: Bowel Sound Present, Soft and Non tender Skin: No Rash Extremities: No Pedal edema, no calf tenderness Neurologic: Grossly Unremarkable. Labs on Admission:  CBC:  Recent Labs Lab 06/13/13 2015  WBC 7.8  NEUTROABS 5.9  HGB 11.6*  HCT 36.1  MCV 92.1  PLT 218    CMP     Component Value Date/Time   NA 138 06/13/2013 2015   K 3.4* 06/13/2013 2015   CL 99 06/13/2013 2015   CO2 28 06/13/2013 2015   GLUCOSE 142* 06/13/2013 2015   BUN 11 06/13/2013 2015   CREATININE 0.56 06/13/2013 2015   CALCIUM 8.8 06/13/2013 2015   GFRNONAA >90 06/13/2013 2015   GFRAA >90 06/13/2013 2015    No results found for this basename: LIPASE, AMYLASE,  in the last 168  hours No results found for this basename: AMMONIA,  in the last 168 hours  No results found for this basename: CKTOTAL, CKMB, CKMBINDEX, TROPONINI,  in the last 168 hours BNP (last 3 results) No results found for this basename: PROBNP,  in the last 8760 hours  Radiological Exams on Admission: Dg Chest 2 View  06/13/2013   CLINICAL DATA:  Chest pain, shortness of breath  EXAM: CHEST  2 VIEW  COMPARISON:  February 18, 2010  FINDINGS: The heart size and mediastinal contours are stable. The aorta is tortuous. There is no focal infiltrate, pulmonary edema, or pleural effusion. The visualized skeletal structures are stable.  IMPRESSION: No active cardiopulmonary disease.   Electronically Signed   By: Sherian Rein M.D.   On: 06/13/2013 20:40   Ct Angio Chest Pe W/cm &/or Wo Cm  06/13/2013   CLINICAL DATA:  Chest fullness with inspiration. Pulmonary embolism.  EXAM: CT ANGIOGRAPHY CHEST WITH CONTRAST  TECHNIQUE: Multidetector CT imaging of the chest was performed using the standard protocol during bolus administration of intravenous contrast. Multiplanar CT image reconstructions and MIPs were obtained to evaluate the vascular anatomy.  CONTRAST:  OMNIPAQUE IOHEXOL 350 MG/ML SOLN  COMPARISON:  None.  FINDINGS: Technically adequate study for evaluation of pulmonary embolus. No pulmonary embolism is present. Three vessel aortic arch is present with tortuous branch vessels. There is no axillary adenopathy. No mediastinal adenopathy. Mediastinal lipomatosis is present. Incidental imaging the upper abdomen demonstrates low attenuation of the liver suggesting hepatosteatosis. 6 mm low-density lesion in the right hepatic dome likely represents cysts. No aggressive osseous lesions are identified. No effusion.  The lung windows demonstrate a 6 mm ground-glass attenuation nodule at the right apex. 4 mm pulmonary nodule is present in the medial right upper lobe.  Old granulomatous calcification is present in the  anterior right hepatic dome over the hemidiaphragm. Dependent atelectasis in the lungs. 4 mm calcified granuloma in the posterior left lower lobe. Calcified AP window lymph nodes incidentally noted. 2 mm subpleural pulmonary nodule in the superior segment of the left lower lobe.  Review of the MIP images confirms the above findings.  IMPRESSION: 1. Negative for pulmonary embolus or acute aortic abnormality. 2. Old granulomatous disease. 3. Scattered pulmonary nodules. Medial right upper lobe 4 mm pulmonary nodule (image 22 series 11). If the patient is at high risk for bronchogenic carcinoma, follow-up chest CT at 1 year is recommended. If the patient is at low risk, no follow-up is needed. This recommendation follows the consensus statement: Guidelines for Management of Small Pulmonary Nodules Detected on CT Scans: A Statement from the Fleischner Society as published in Radiology 2005; 237:395-400.   Electronically Signed   By: Andreas Newport M.D.   On: 06/13/2013 22:28    EKG: Independently reviewed. normal sinus rhythm, nonspecific ST and T waves changes,  PR segment depression.  Assessment/Plan Principal Problem:   Chest pain Active  Problems:   Hypertension   Possible Acute pericarditis, unspecified   1. Chest pain  the patient is presenting with complaints of chest pain which was substernal and pressure-like sensation associated with shortness of breath and improved with nitroglycerin. Her EKG does not show any significant acute ischemia. Thus she will be admitted for serial telemetry, and troponin, echocardiogram. Excellent she will be kept n.p.o. for possible stress test in morning. Her EKG does show on diffuse PR segment depression and with her symptoms of worsening of her pain with leaning forward is highly suggestive of pericarditis. I will place her on colchicine 0.6 twice a day and once she is ruled out for ACS recommend to place her on Indocin ibuprofen. Continue aspirin 325 mg  daily.  2. hypertension  Continue her home antihypertensive medications blood pressure stable at present  DVT Prophylaxis: subcutaneous Heparin Nutrition:  n.p.o.   Code Status:  full   Family Communication:  family  was present at bedside, opportunity was given to ask question and all questions were answered satisfactorily at the time of interview. Disposition: Admitted to observation in telemetry unit.  Author: Lynden OxfordPranav Jahziel Sinn, MD [facility] Pager: [phone] 06/14/2013, 12:11 AM    If 7PM-7AM, please contact night-coverage [email or web address] Password [email or web address]

## 2013-06-14 NOTE — Progress Notes (Signed)
UR completed 

## 2013-06-15 DIAGNOSIS — I517 Cardiomegaly: Secondary | ICD-10-CM

## 2013-06-15 DIAGNOSIS — R918 Other nonspecific abnormal finding of lung field: Secondary | ICD-10-CM | POA: Diagnosis present

## 2013-06-15 MED ORDER — PANTOPRAZOLE SODIUM 40 MG PO TBEC: 40.0000 mg | DELAYED_RELEASE_TABLET | Freq: Every day | ORAL | Status: DC

## 2013-06-15 MED ORDER — IBUPROFEN 800 MG PO TABS: 800.0000 mg | ORAL_TABLET | Freq: Three times a day (TID) | ORAL | Status: DC

## 2013-06-15 MED ORDER — METOPROLOL TARTRATE 25 MG PO TABS: 25.0000 mg | ORAL_TABLET | Freq: Two times a day (BID) | ORAL | Status: DC

## 2013-06-15 NOTE — Discharge Summary (Signed)
Physician Discharge Summary  Melinda Mann FAO:[id number] DOB: [birth year] DOA: 06/13/2013  PCP: Michiel SitesKOHUT,WALTER DENNIS, MD  Admit date: 06/13/2013 Discharge date: 06/15/2013  Time spent: 40 minutes  Recommendations for Outpatient Follow-up:   Chest pain  -Obtain troponin x3 negative  -ProBNP  -Echocardiogram pending  - EKG does show diffuse PR segment depression and with her symptoms of worsening of her pain with leaning forward is highly suggestive of pericarditis.  -Continue aspirin 325 mg daily.   Pericarditis  -Discharge on Motrin 800 mg  TID with food    Hypertension  -Not within AHA guidelines continue  metoprolol 25 mg BID  -Continue her home antihypertensive medications blood pressure stable at present  LVH -See hypertension  -Patient to followup with PCP to titrate BP medication  Scattered pulmonary nodules - does not meet pathologic criteria however Medial right upper lobe 4 mm  pulmonary nodule (image 22 series 11). If the patient is at high  risk for bronchogenic carcinoma, follow-up chest CT at 1 year is  recommended. If the patient is at low risk, no follow-up is needed     Discharge Diagnoses:  Principal Problem:   Chest pain Active Problems:   Hypertension   Possible Acute pericarditis, unspecified   LVH (left ventricular hypertrophy)   Pulmonary nodules/lesions, multiple   Discharge Condition: Stable  Diet recommendation: Heart healthy  Filed Weights   06/14/13 0120  Weight: 97.9 kg (215 lb 13.3 oz)    History of present illness:  Melinda [name] is a 73 y.o. WF PMHx  hypertension and TIA.  The patient is coming from home.  The patient presented with complaints of chest pain that is ongoing since this evening. She mentions the pain was initially associated with exertion and was worsening with exertion. The pain was pressure like sensation located substernally and worsening with leaning forward. She also had worsening of her pain with deep  inspiration he does she had some shortness of breath on exertion which is resolved at present. She denies any fever or recent upper respiratory infection. She has family history of coronary artery disease. She denies any prior coronary work up. She denies any similar symptoms in the past. No recent travel or immobilization or leg swelling. Although she complains of some constitutional symptoms of generalized bodyache that has been ongoing since last week or so.  She is compliant with her medication and there is no change in her medication she has taken a total dose of aspirin prior to her arrival to ED. Her pain was completely resolved with nitroglycerin. 2/12 negative CP negative SOB patient stable for discharge     Procedures: CT angiogram chest PE protocol at 06/13/2013 1. Negative for pulmonary embolus or acute aortic abnormality.  2. Old granulomatous disease.  3. Scattered pulmonary nodules. Medial right upper lobe 4 mm  pulmonary nodule (image 22 series 11). If the patient is at high  risk for bronchogenic carcinoma, follow-up chest CT at 1 year is  recommended. If the patient is at low risk, no follow-up is needed.  This recommendation follows the consensus statement: Guidelines for  Management of Small Pulmonary Nodules Detected on CT Scans: A  Statement from the Fleischner Society as published in Radiology   CXR 06/13/2013 The heart size and mediastinal contours are stable. The aorta is  tortuous. There is no focal infiltrate, pulmonary edema, or pleural  effusion. The visualized skeletal structures are stable.  IMPRESSION:  No active cardiopulmonary disease   Consultations:  Antibiotics    Discharge Exam: Filed Vitals:   06/14/13 2152 06/15/13 0554 06/15/13 1038 06/15/13 1300  BP: 156/86 160/78 156/83 126/60  Pulse: 72 58 60 58  Temp: 98.4 F (36.9 C) 97.7 F (36.5 C) 98 F (36.7 C) 97.4 F (36.3 C)  TempSrc: Oral Oral Oral Oral  Resp: 20 18 18 18   Height:       Weight:      SpO2: 97% 99% 96% 98%   General: A./O. x4, NAD   Cardiovascular: Regular rhythm and rate, negative murmurs rubs or gallops, DP/PT pulse plus one  Respiratory: Good auscultation by the  Abdomen: Soft, nontender, nondistended, positive bowel sounds  Musculoskeletal: Negative pedal edema   Discharge Instructions     Medication List         escitalopram 10 MG tablet  Commonly known as:  LEXAPRO  Take 10 mg by mouth daily.     GARLIC PO  Take 2 capsules by mouth daily.     ibuprofen 800 MG tablet  Commonly known as:  ADVIL,MOTRIN  Take 1 tablet (800 mg total) by mouth 3 (three) times daily. With food     LORazepam 1 MG tablet  Commonly known as:  ATIVAN  Take 1 mg by mouth 2 (two) times daily as needed for anxiety.     metoprolol tartrate 25 MG tablet  Commonly known as:  LOPRESSOR  Take 1 tablet (25 mg total) by mouth 2 (two) times daily.     multivitamin with minerals Tabs tablet  Take 1 tablet by mouth daily.     olmesartan-hydrochlorothiazide 40-25 MG per tablet  Commonly known as:  BENICAR HCT  Take 1 tablet by mouth daily.       No Known Allergies    The results of significant diagnostics from this hospitalization (including imaging, microbiology, ancillary and laboratory) are listed below for reference.    Significant Diagnostic Studies: Dg Chest 2 View  06/13/2013   CLINICAL DATA:  Chest pain, shortness of breath  EXAM: CHEST  2 VIEW  COMPARISON:  February 18, 2010  FINDINGS: The heart size and mediastinal contours are stable. The aorta is tortuous. There is no focal infiltrate, pulmonary edema, or pleural effusion. The visualized skeletal structures are stable.  IMPRESSION: No active cardiopulmonary disease.   Electronically Signed   By: Sherian Rein M.D.   On: 06/13/2013 20:40   Ct Angio Chest Pe W/cm &/or Wo Cm  06/13/2013   CLINICAL DATA:  Chest fullness with inspiration. Pulmonary embolism.  EXAM: CT ANGIOGRAPHY CHEST WITH CONTRAST   TECHNIQUE: Multidetector CT imaging of the chest was performed using the standard protocol during bolus administration of intravenous contrast. Multiplanar CT image reconstructions and MIPs were obtained to evaluate the vascular anatomy.  CONTRAST:  OMNIPAQUE IOHEXOL 350 MG/ML SOLN  COMPARISON:  None.  FINDINGS: Technically adequate study for evaluation of pulmonary embolus. No pulmonary embolism is present. Three vessel aortic arch is present with tortuous branch vessels. There is no axillary adenopathy. No mediastinal adenopathy. Mediastinal lipomatosis is present. Incidental imaging the upper abdomen demonstrates low attenuation of the liver suggesting hepatosteatosis. 6 mm low-density lesion in the right hepatic dome likely represents cysts. No aggressive osseous lesions are identified. No effusion.  The lung windows demonstrate a 6 mm ground-glass attenuation nodule at the right apex. 4 mm pulmonary nodule is present in the medial right upper lobe.  Old granulomatous calcification is present in the anterior right hepatic dome over the hemidiaphragm. Dependent atelectasis  in the lungs. 4 mm calcified granuloma in the posterior left lower lobe. Calcified AP window lymph nodes incidentally noted. 2 mm subpleural pulmonary nodule in the superior segment of the left lower lobe.  Review of the MIP images confirms the above findings.  IMPRESSION: 1. Negative for pulmonary embolus or acute aortic abnormality. 2. Old granulomatous disease. 3. Scattered pulmonary nodules. Medial right upper lobe 4 mm pulmonary nodule (image 22 series 11). If the patient is at high risk for bronchogenic carcinoma, follow-up chest CT at 1 year is recommended. If the patient is at low risk, no follow-up is needed. This recommendation follows the consensus statement: Guidelines for Management of Small Pulmonary Nodules Detected on CT Scans: A Statement from the Fleischner Society as published in Radiology 2005; 237:395-400.    Electronically Signed   By: Andreas Newport M.D.   On: 06/13/2013 22:28    Microbiology: No results found for this or any previous visit (from the past 240 hour(s)).   Labs: Basic Metabolic Panel:  Recent Labs Lab 06/13/13 2015  NA 138  K 3.4*  CL 99  CO2 28  GLUCOSE 142*  BUN 11  CREATININE 0.56  CALCIUM 8.8   Liver Function Tests: No results found for this basename: AST, ALT, ALKPHOS, BILITOT, PROT, ALBUMIN,  in the last 168 hours No results found for this basename: LIPASE, AMYLASE,  in the last 168 hours No results found for this basename: AMMONIA,  in the last 168 hours CBC:  Recent Labs Lab 06/13/13 2015  WBC 7.8  NEUTROABS 5.9  HGB 11.6*  HCT 36.1  MCV 92.1  PLT 218   Cardiac Enzymes:  Recent Labs Lab 06/14/13 0240 06/14/13 0822 06/14/13 1438  TROPONINI <0.30 <0.30 <0.30   BNP: BNP (last 3 results) No results found for this basename: PROBNP,  in the last 8760 hours CBG: No results found for this basename: GLUCAP,  in the last 168 hours     Signed:  Carolyne Littles, MD [facility] [phone] pager

## 2013-06-15 NOTE — Progress Notes (Signed)
This patient is receiving Protonix. Based on criteria approved by the Pharmacy and Therapeutics Committee, this medication is being converted to the equivalent oral dose form. These criteria include:   . The patient is eating (either orally or per tube) and/or has been taking other orally administered medications for at least 24 hours.  . This patient has no evidence of active gastrointestinal bleeding or impaired GI absorption (gastrectomy, short bowel, patient on TNA or NPO).   If you have questions about this conversion, please contact the pharmacy department.  Berkley HarveyLegge, Zeyad Delaguila Marshall, [facility] Medical CenterRPH 06/15/2013 1:10 PM

## 2013-07-31 ENCOUNTER — Encounter: Payer: Self-pay | Admitting: Internal Medicine

## 2013-08-08 ENCOUNTER — Institutional Professional Consult (permissible substitution): Payer: Medicare Other | Admitting: Internal Medicine

## 2013-08-17 ENCOUNTER — Other Ambulatory Visit: Payer: Self-pay | Admitting: Gynecology

## 2013-08-22 ENCOUNTER — Encounter: Payer: Self-pay | Admitting: Internal Medicine

## 2013-08-22 ENCOUNTER — Ambulatory Visit (INDEPENDENT_AMBULATORY_CARE_PROVIDER_SITE_OTHER): Payer: Medicare Other | Admitting: Internal Medicine

## 2013-08-22 VITALS — BP 124/68 | HR 70 | Temp 98.2°F | Ht 66.0 in | Wt 209.6 lb

## 2013-08-22 DIAGNOSIS — R918 Other nonspecific abnormal finding of lung field: Secondary | ICD-10-CM

## 2013-08-22 NOTE — Patient Instructions (Signed)
You need a follow CT in 06/2014 and we call you to make sure it gets done.

## 2013-08-22 NOTE — Progress Notes (Signed)
Subjective:    Patient ID: Melinda LavMargie M Mcglynn, female    DOB: 02/08/1941,    MRN: [id number]  HPI  [age] yowf never smoker referred 08/22/2013 to pulmonary clinic  by Dr Juleen ChinaKohut for abn CT Chest.  08/22/2013 [facility] office visit/ Montrey Buist  Chief Complaint  Patient presents with  . Pulmonary Consult    Referred per Dr. Darci NeedleWalter Kohut for eval of pulmonary nodules. Pt denies any respiratory co's.   pt with h/o variable spring > fall nasal symptoms x decades controlled with flonase   New onset abrupt pain generalized ant cp with deep breath > ER   Admit date: 06/13/2013  Discharge date: 06/15/2013   Chest pain  -Obtain troponin x3 negative  -ProBNP  -Echocardiogram pending  - EKG does show diffuse PR segment depression and with her symptoms of worsening of her pain with leaning forward is highly suggestive of pericarditis.  -Continue aspirin 325 mg daily.  Pericarditis  -Discharge on Motrin 800 mg TID with food  Hypertension  -Not within AHA guidelines continue metoprolol 25 mg BID  -Continue her home antihypertensive medications blood pressure stable at present  LVH  -See hypertension  -Patient to followup with PCP to titrate BP medication  Scattered pulmonary nodules  - does not meet pathologic criteria however Medial right upper lobe 4 mm  pulmonary nodule (image 22 series 11). If the patient is at high  risk for bronchogenic carcinoma, follow-up chest CT at 1 year is  recommended. If the patient is at low risk, no follow-up is needed    Discharge Diagnoses:  Principal Problem:  Chest pain  Active Problems:  Hypertension  Possible Acute pericarditis, unspecified  LVH (left ventricular hypertrophy)  Pulmonary nodules/lesions, multiple  Discharge Condition: Stable  Diet recommendation: Heart healthy  Filed Weights    06/14/13 0120   Weight:  97.9 kg (215 lb 13.3 oz)   History of present illness:  Melinda Mann is a 73 y.o. WF PMHx hypertension and TIA.  The patient  is coming from home.  The patient presented with complaints of chest pain that is ongoing since this evening. She mentions the pain was initially associated with exertion and was worsening with exertion. The pain was pressure like sensation located substernally and worsening with leaning forward. She also had worsening of her pain with deep inspiration he does she had some shortness of breath on exertion which is resolved at present. She denies any fever or recent upper respiratory infection. She has family history of coronary artery disease. She denies any prior coronary work up. She denies any similar symptoms in the past. No recent travel or immobilization or leg swelling. Although she complains of some constitutional symptoms of generalized bodyache that has been ongoing since last week or so.  She is compliant with her medication and there is no change in her medication she has taken a total dose of aspirin prior to her arrival to ED. Her pain was completely resolved with nitroglycerin. 2/12 negative CP negative SOB patient stable for discharge    At ov 08/22/13 all cp and sob resolved, no on any pulmonary rx  No obvious patterns in day to day or daytime variabilty or assoc  chest tightness, subjective wheeze overt sinus  symptoms. No unusual exp hx or h/o childhood pna/ asthma or knowledge of premature birth.  Sleeping ok without nocturnal  or early am exacerbation  of respiratory  c/o's or need for noct saba. Also denies any obvious fluctuation  of symptoms with weather or environmental changes or other aggravating or alleviating factors except as outlined above   Current Medications, Allergies, Complete Past Medical History, Past Surgical History, Family History, and Social History were [facility].            Review of Systems  Constitutional: Negative for fever, chills and unexpected weight change.  HENT: Negative for congestion, dental problem,  ear pain, nosebleeds, postnasal drip, rhinorrhea, sinus pressure, sneezing, sore throat, trouble swallowing and voice change.   Eyes: Negative for visual disturbance.  Respiratory: Negative for cough, choking and shortness of breath.   Cardiovascular: Negative for chest pain and leg swelling.  Gastrointestinal: Negative for vomiting, abdominal pain and diarrhea.  Genitourinary: Negative for difficulty urinating.       Heartburn  Musculoskeletal: Positive for arthralgias.  Skin: Negative for rash.  Neurological: Negative for tremors, syncope and headaches.  Hematological: Does not bruise/bleed easily.       Objective:   Physical Exam  amb wf nad  Wt Readings from Last 3 Encounters:  08/22/13 209 lb 9.6 oz (95.074 kg)  06/14/13 215 lb 13.3 oz (97.9 kg)  05/07/13 215 lb (97.523 kg)     HEENT: nl dentition, turbinates, and orophanx. Nl external ear canals without cough reflex   NECK :  without JVD/Nodes/TM/ nl carotid upstrokes bilaterally   LUNGS: no acc muscle use, clear to A and P bilaterally without cough on insp or exp maneuvers   CV:  RRR  no s3 or murmur or increase in P2, no edema   ABD:  soft and nontender with nl excursion in the supine position. No bruits or organomegaly, bowel sounds nl  MS:  warm without deformities, calf tenderness, cyanosis or clubbing  SKIN: warm and dry without lesions    NEURO:  alert, approp, no deficits     CTa chest 06/13/13 1. Negative for pulmonary embolus or acute aortic abnormality.  2. Old granulomatous disease.  3. Scattered pulmonary nodules. Medial right upper lobe 4 mm  pulmonary nodule (image 22 series 11). If the patient is at high  risk for bronchogenic carcinoma, follow-up chest CT at 1 year is  recommended. If the patient is at low risk, no follow-up is needed.      Assessment & Plan:

## 2013-08-24 NOTE — Assessment & Plan Note (Signed)
Discussed in detail all the  indications, usual  risks and alternatives  relative to the benefits with patient who agrees to proceed with f/u CT in one year [facility], reviewed.    This is really the only option and is not really necessary given she is very low risk but very anxious.    See instructions for specific recommendations which were reviewed directly with the patient who was given a copy with highlighter outlining the key components.

## 2014-06-11 ENCOUNTER — Other Ambulatory Visit: Payer: Self-pay | Admitting: Internal Medicine

## 2014-06-11 DIAGNOSIS — R918 Other nonspecific abnormal finding of lung field: Secondary | ICD-10-CM

## 2014-06-25 ENCOUNTER — Other Ambulatory Visit: Payer: Self-pay

## 2014-06-27 ENCOUNTER — Inpatient Hospital Stay: Admission: RE | Admit: 2014-06-27 | Payer: Self-pay | Source: Ambulatory Visit

## 2014-07-23 ENCOUNTER — Ambulatory Visit (INDEPENDENT_AMBULATORY_CARE_PROVIDER_SITE_OTHER)
Admission: RE | Admit: 2014-07-23 | Discharge: 2014-07-23 | Disposition: A | Discharge status: Home / Self Care | Payer: Medicare Other | Source: Ambulatory Visit | Attending: Internal Medicine | Admitting: Internal Medicine

## 2014-07-23 DIAGNOSIS — R918 Other nonspecific abnormal finding of lung field: Secondary | ICD-10-CM

## 2014-07-25 NOTE — Progress Notes (Signed)
Quick Note:  Spoke with pt and notified of results per Dr. Wert. Pt verbalized understanding and denied any questions.  ______ 

## 2015-11-06 IMAGING — CT CT HEAD W/O CM
2 series · 17 of 30 positions shown, 20 images · non-contrast
Comparison: 07/19/2008

CLINICAL DATA: Fell hit head, left eyebrow swelling

EXAM:
CT HEAD WITHOUT CONTRAST
TECHNIQUE: Contiguous axial images were obtained from the base of the skull
through the vertex without intravenous contrast.

[Series 2: bone windows · axial · 0.45mm/px · z∈[+1300,+1414]mm · 8 of 50 slices shown]
[im 6/50  bone]
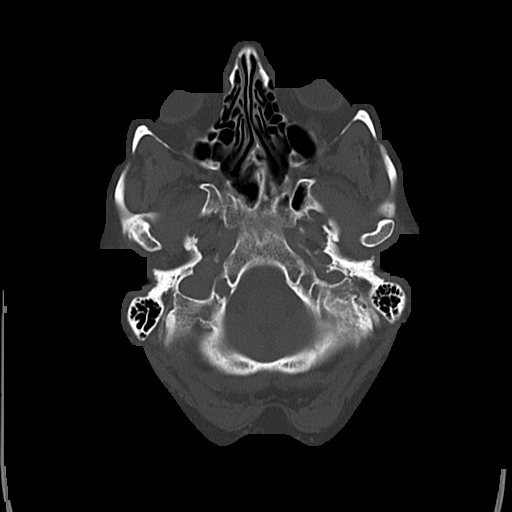
[im 11/50  bone]
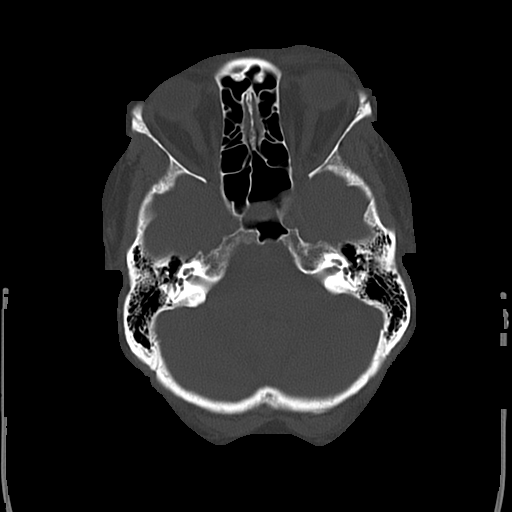
[im 17/50  bone]
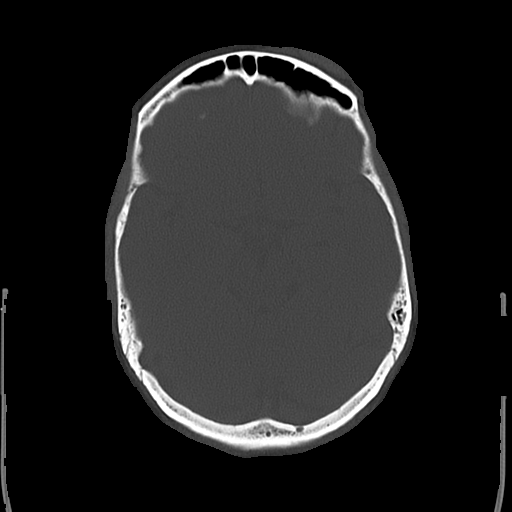
[im 22/50  bone]
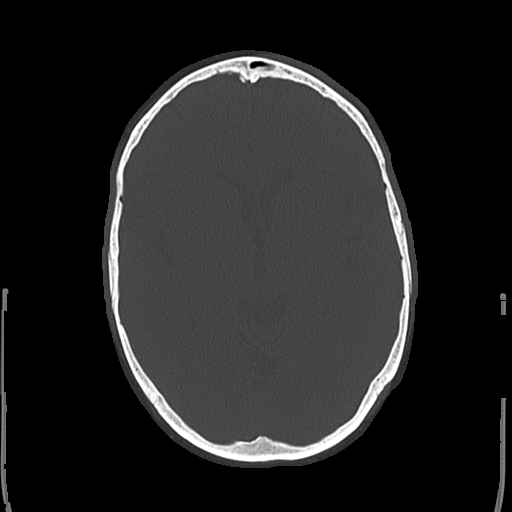
[im 28/50  bone]
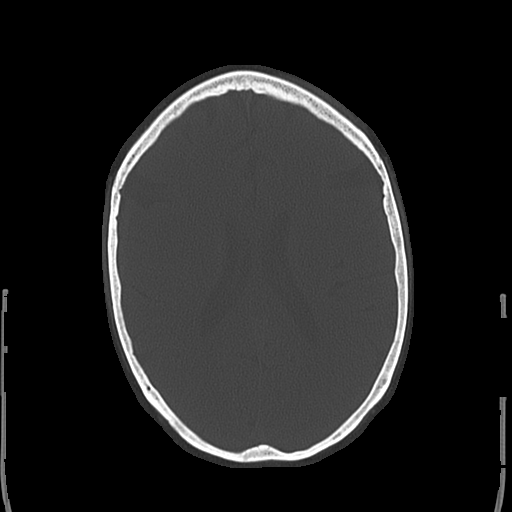
[im 33/50  bone]
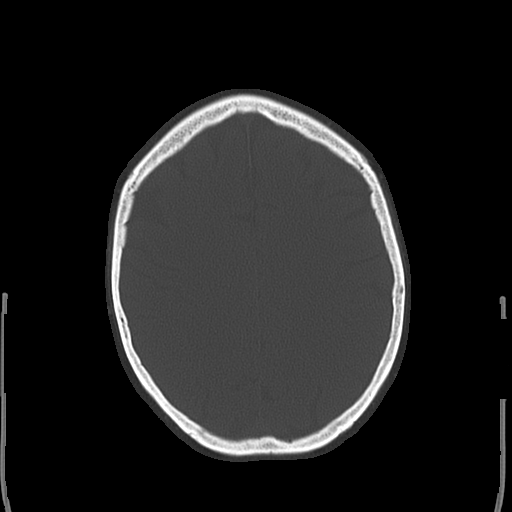
[im 39/50  bone]
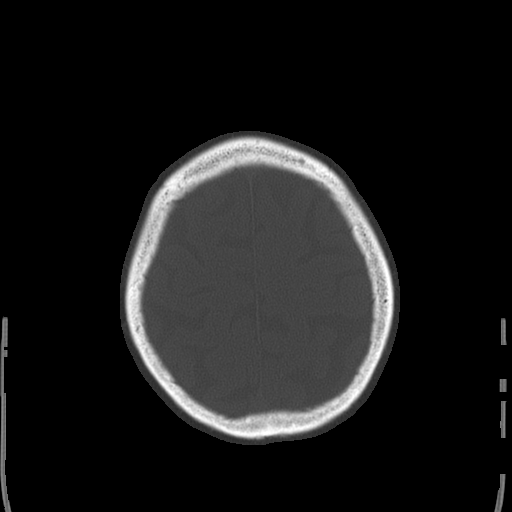
[im 44/50  bone]
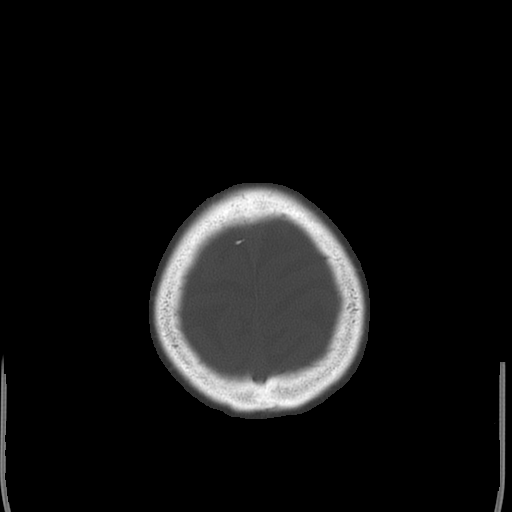

[Series 3: head w/o · axial · non-contrast · 0.45mm/px · z∈[+1295,+1415]mm · 9 of 30 slices shown, 12 images]
[im 3/30  brain]
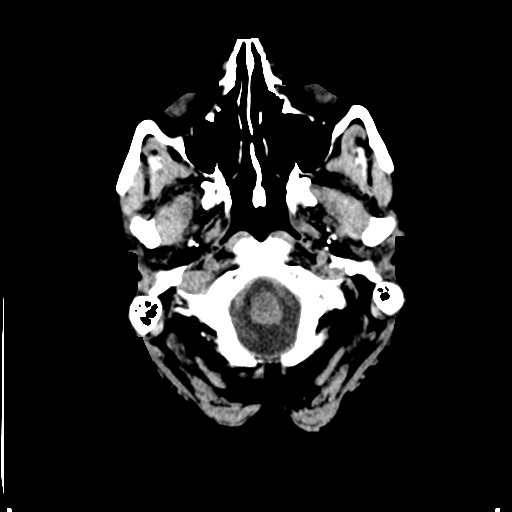
[im 3/30  bone]
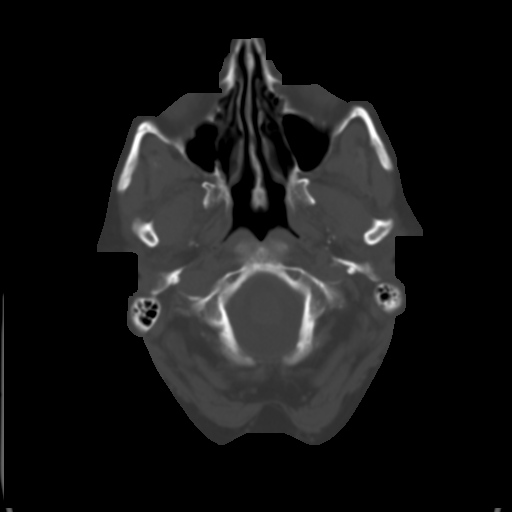
[im 6/30  brain]
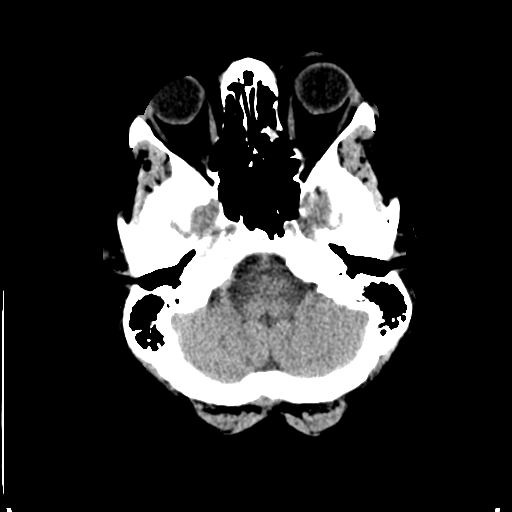
[im 9/30  brain]
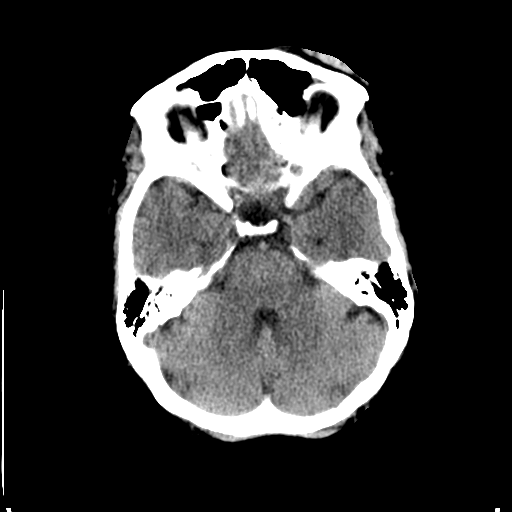
[im 12/30  brain]
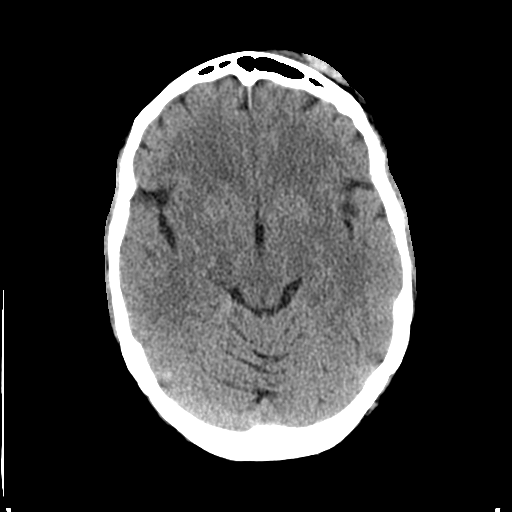
[im 15/30  brain]
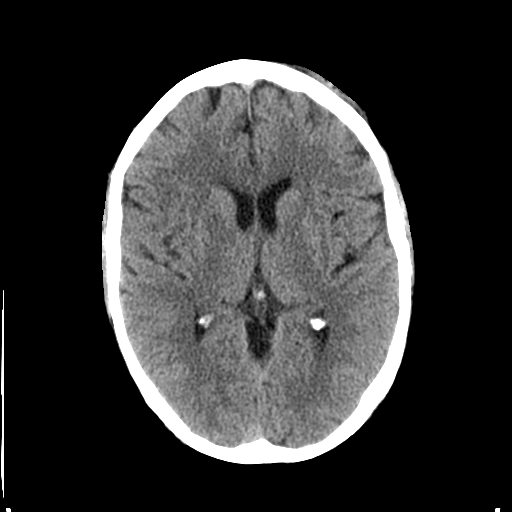
[im 15/30  bone]
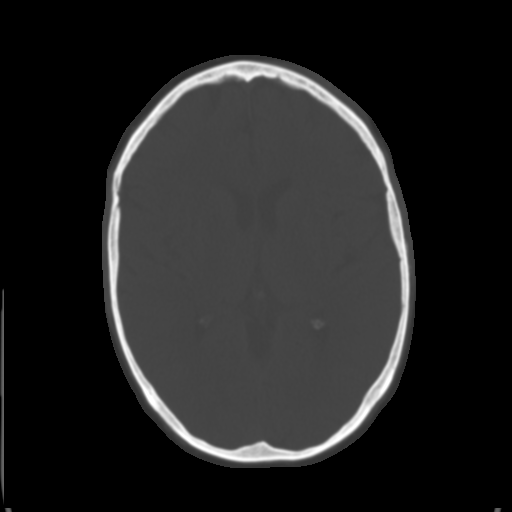
[im 18/30  brain]
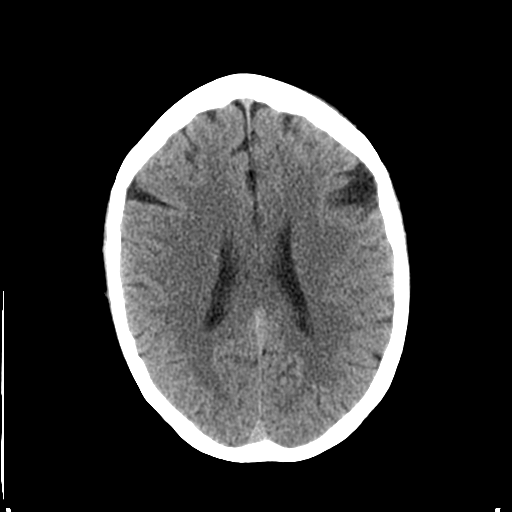
[im 21/30  brain]
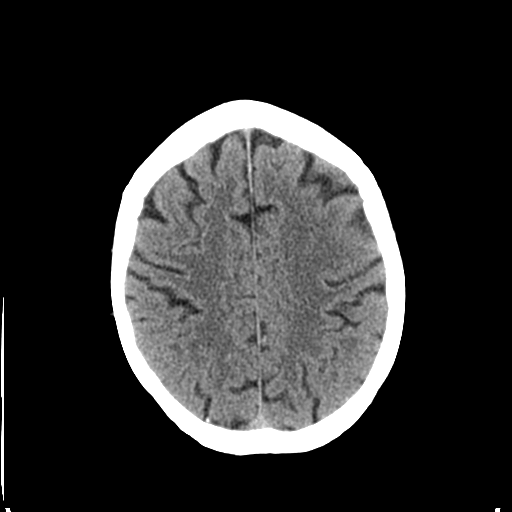
[im 24/30  brain]
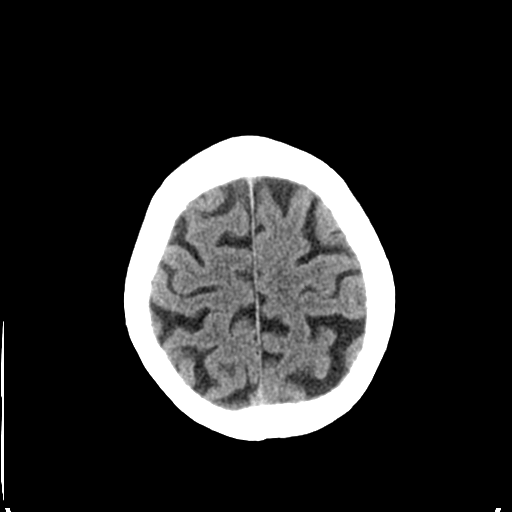
[im 27/30  brain]
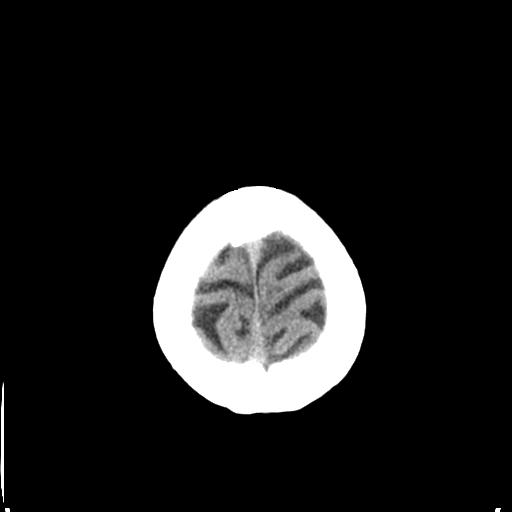
[im 27/30  bone]
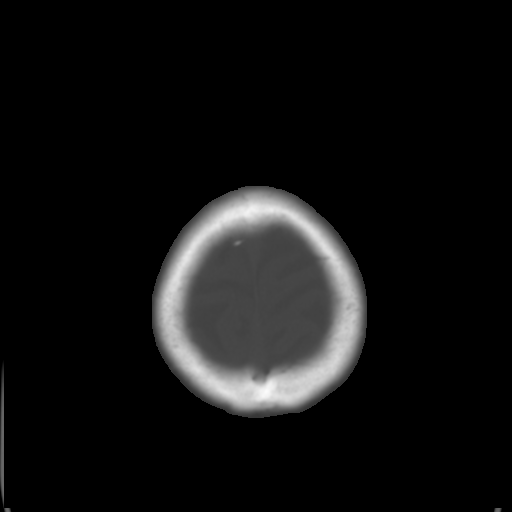

[17 of 30 positions shown; findings below may reference images not displayed]

FINDINGS: Left frontal scalp hematoma. No skull fracture. No hemorrhage,
infarct, or extra-axial fluid. No hydrocephalus. There is a 1 cm
calcified extra-axial mass posterior right parietal region likely a
meningioma. It is stable.
IMPRESSION: No acute intracranial abnormalities.  Left scalp hematoma.

## 2015-11-06 IMAGING — CR DG SHOULDER 1V*L*
1 series · 3 of 3 positions shown · non-contrast
Comparison: None.

CLINICAL DATA: Status post left shoulder reduction

EXAM:
PORTABLE LEFT SHOULDER - 2+ VIEW

[Series 1: ap int/ext rotation · left · 3 of 3 slices shown]
[im 1/3]
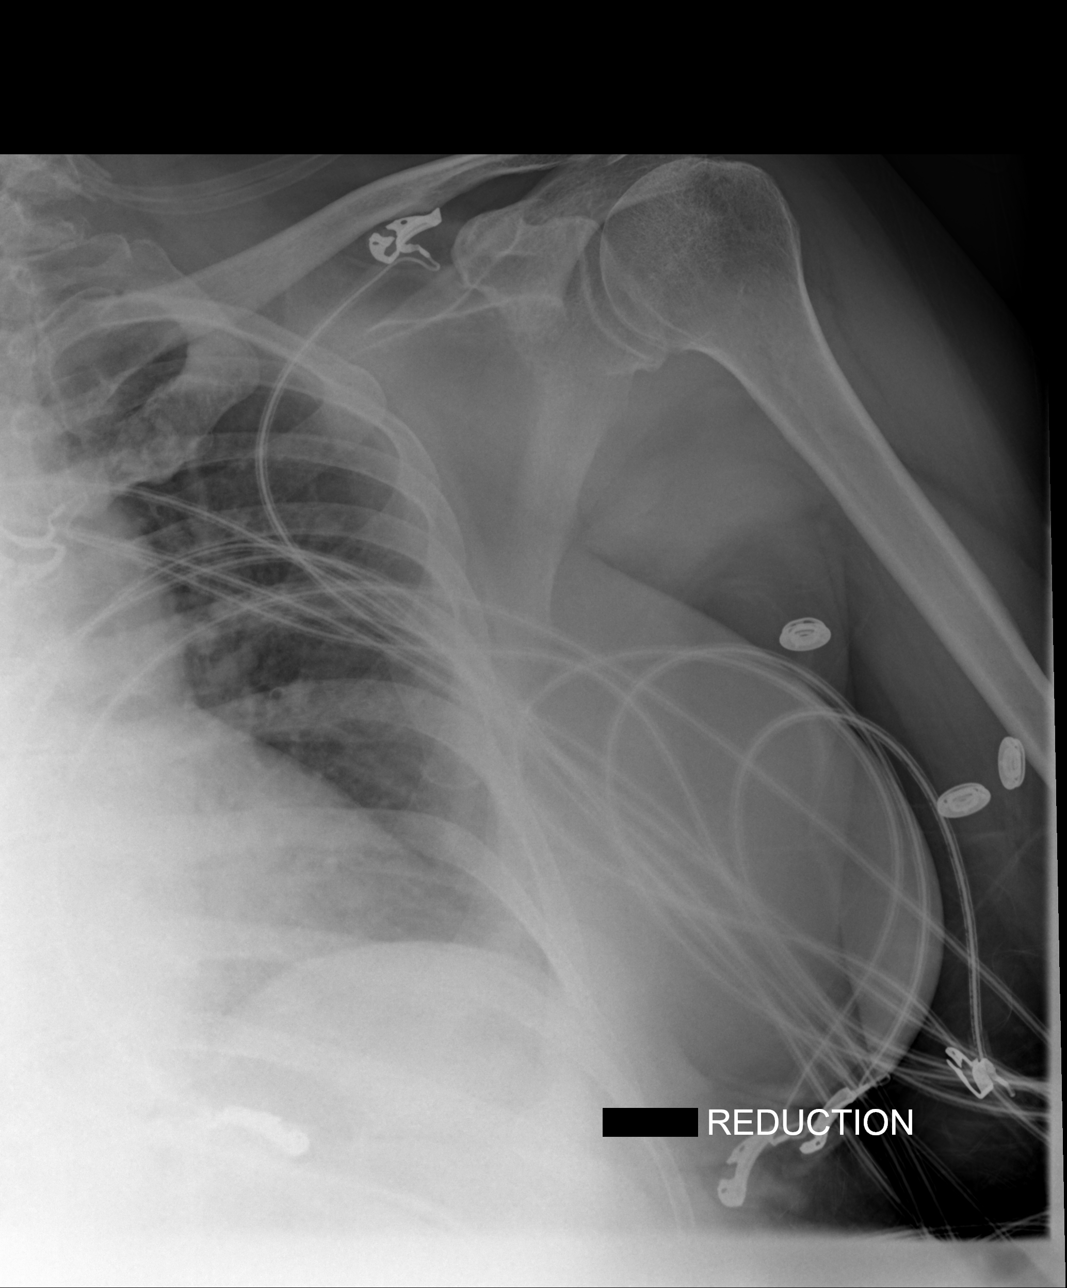
[im 2/3]
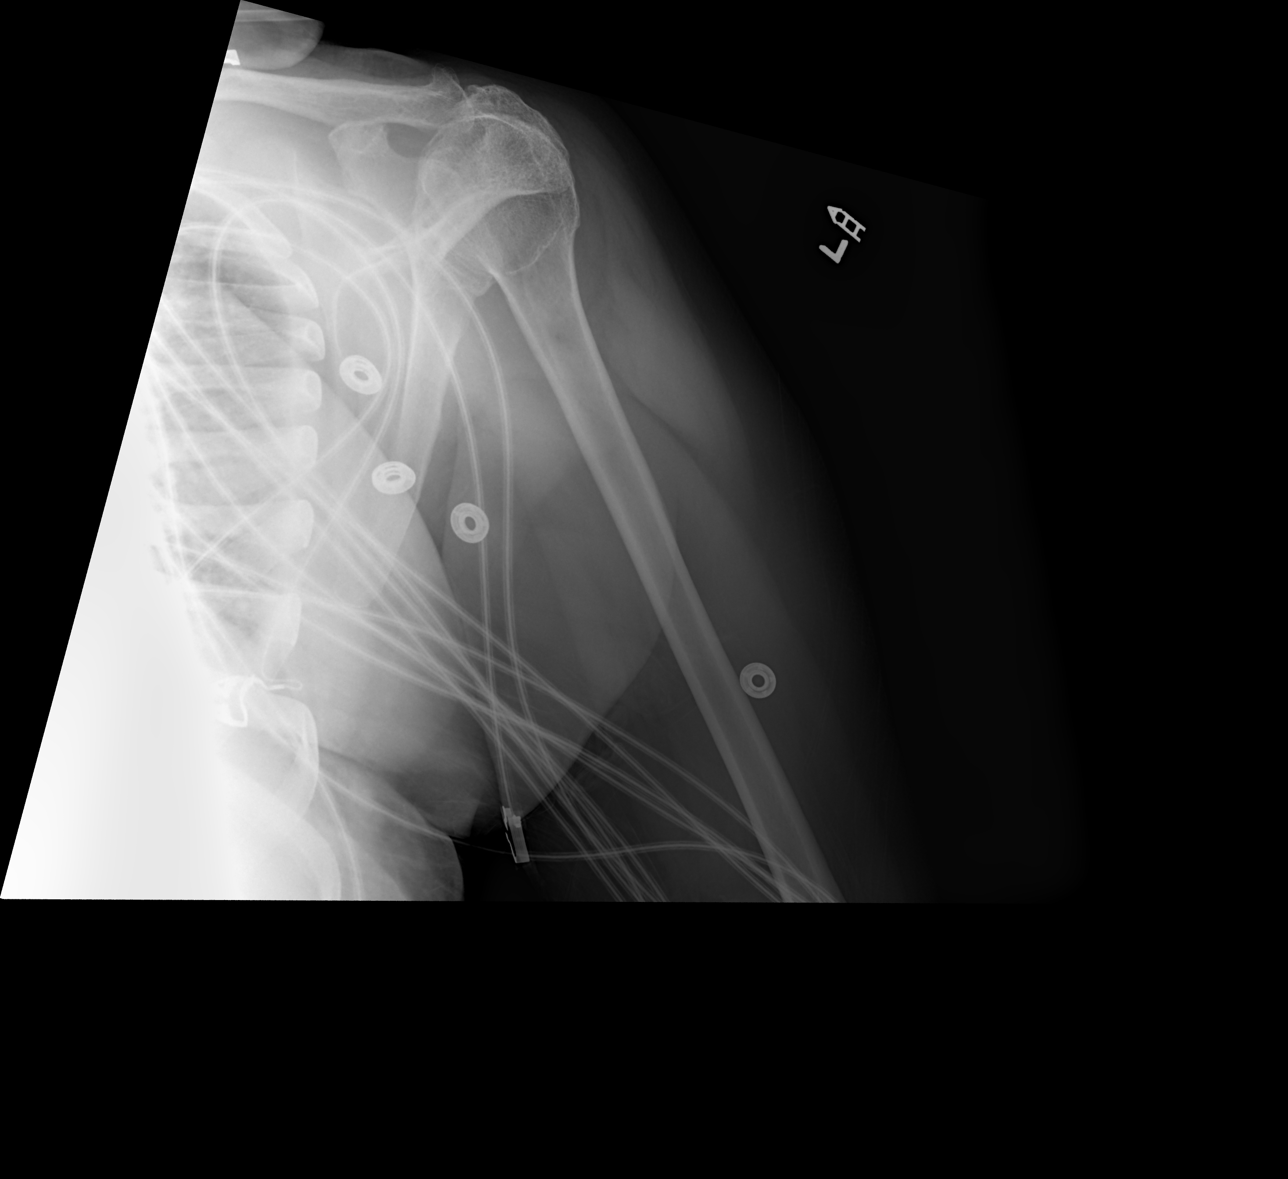
[im 3/3]
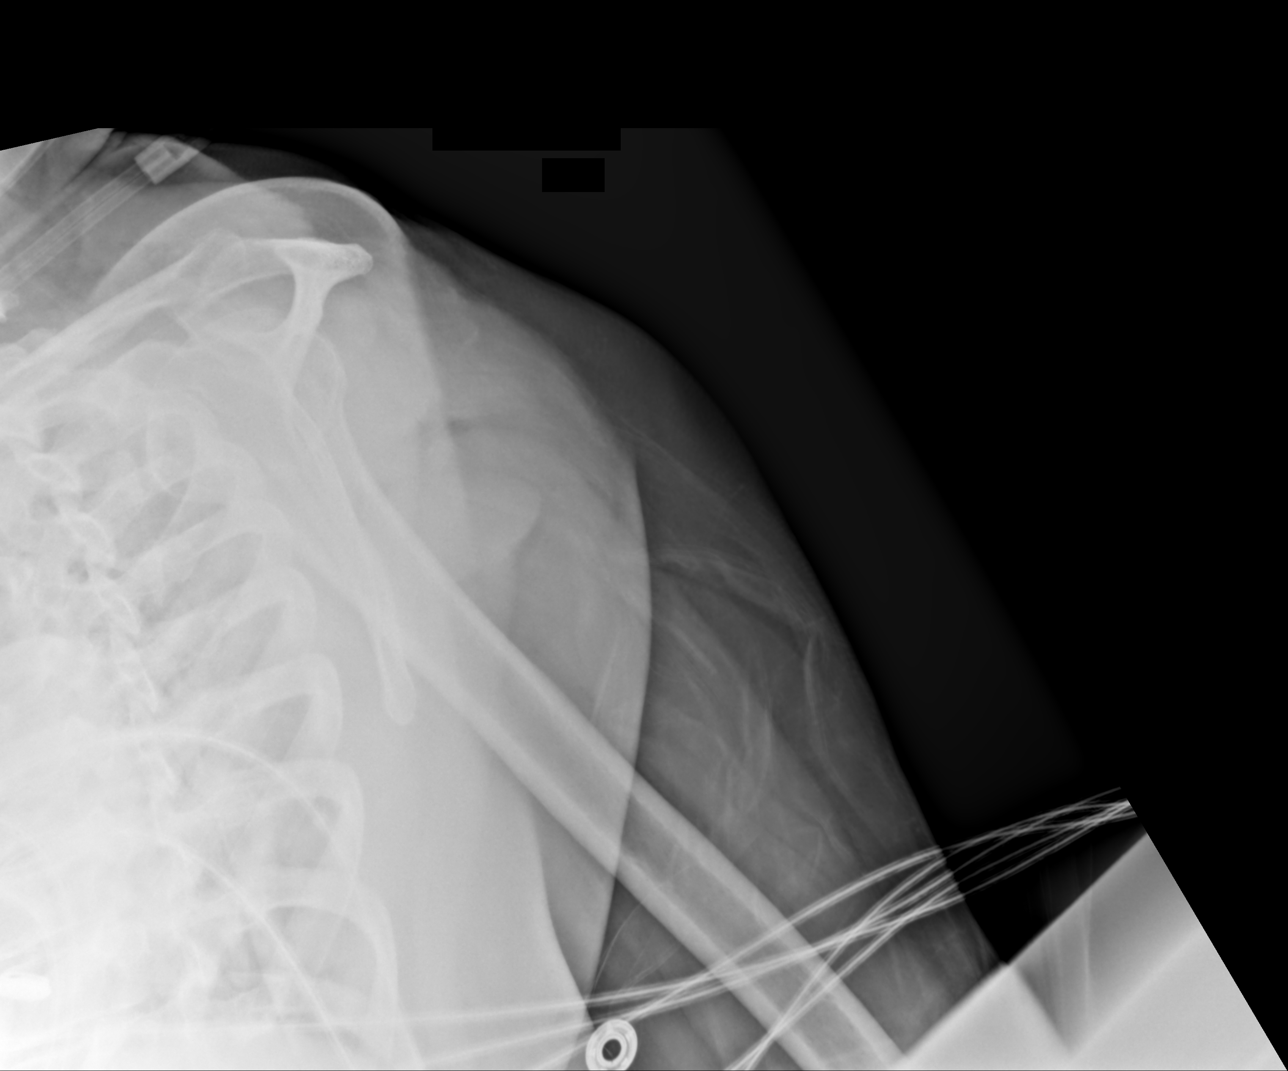

[3 of 3 positions shown; findings below may reference images not displayed]

FINDINGS: There is no shoulder fracture or dislocation. There are degenerative
changes of the acromioclavicular joint. There are mild degenerative
changes of the left glenohumeral joint.
IMPRESSION: No acute osseous injury of the left shoulder.

## 2016-04-08 ENCOUNTER — Ambulatory Visit: Payer: Medicare Other | Admitting: Podiatry

## 2016-09-17 ENCOUNTER — Ambulatory Visit: Payer: Medicare Other | Admitting: Podiatry

## 2017-01-24 ENCOUNTER — Encounter (HOSPITAL_COMMUNITY): Payer: Self-pay | Admitting: Emergency Medicine

## 2017-01-24 ENCOUNTER — Emergency Department (HOSPITAL_COMMUNITY)
Admission: EM | Admit: 2017-01-24 | Discharge: 2017-01-24 | Disposition: A | Discharge status: Home / Self Care | Payer: Medicare Other | Attending: Emergency Medicine | Admitting: Emergency Medicine

## 2017-01-24 DIAGNOSIS — Z79899 Other long term (current) drug therapy: Secondary | ICD-10-CM | POA: Insufficient documentation

## 2017-01-24 DIAGNOSIS — E119 Type 2 diabetes mellitus without complications: Secondary | ICD-10-CM | POA: Diagnosis not present

## 2017-01-24 DIAGNOSIS — I10 Essential (primary) hypertension: Secondary | ICD-10-CM | POA: Diagnosis not present

## 2017-01-24 DIAGNOSIS — Z0189 Encounter for other specified special examinations: Secondary | ICD-10-CM | POA: Diagnosis not present

## 2017-01-24 DIAGNOSIS — E875 Hyperkalemia: Secondary | ICD-10-CM | POA: Diagnosis present

## 2017-01-24 HISTORY — DX: Type 2 diabetes mellitus without complications: E11.9

## 2017-01-24 LAB — CBC
HCT: 34.2 % — ABNORMAL LOW (ref 36.0–46.0)
Hemoglobin: 11.1 g/dL — ABNORMAL LOW (ref 12.0–15.0)
MCH: 29.9 pg (ref 26.0–34.0)
MCHC: 32.5 g/dL (ref 30.0–36.0)
MCV: 92.2 fL (ref 78.0–100.0)
PLATELETS: 224 10*3/uL (ref 150–400)
RBC: 3.71 MIL/uL — ABNORMAL LOW (ref 3.87–5.11)
RDW: 13.8 % (ref 11.5–15.5)
WBC: 7.1 10*3/uL (ref 4.0–10.5)

## 2017-01-24 LAB — BASIC METABOLIC PANEL
Anion gap: 10 (ref 5–15)
BUN: 17 mg/dL (ref 6–20)
CO2: 24 mmol/L (ref 22–32)
CREATININE: 0.74 mg/dL (ref 0.44–1.00)
Calcium: 9.3 mg/dL (ref 8.9–10.3)
Chloride: 105 mmol/L (ref 101–111)
GFR calc non Af Amer: >60 mL/min (ref >60)
Glucose, Bld: 147 mg/dL — ABNORMAL HIGH (ref 65–99)
Potassium: 3.6 mmol/L (ref 3.5–5.1)
Sodium: 139 mmol/L (ref 135–145)

## 2017-01-24 NOTE — ED Provider Notes (Signed)
Patient had prior lab work performed at urgent care center consistent with hyperkalemia. She is asymptomatic presently denies any complaint   Doug Sou, MD 01/24/17 1500

## 2017-01-24 NOTE — ED Provider Notes (Signed)
[facility]-EMERGENCY DEPT Provider Note   CSN: [id number] Arrival date & time: 01/24/17  1134     History   Chief Complaint Chief Complaint  Patient presents with  . Abnormal Lab    HPI Melinda Mann is a 76 y.o. female presents to the ED for blood work. Patient reports that the Urgent Care called her and told her her potassium level was high and to come to the ED for recheck.  HPI  Past Medical History:  Diagnosis Date  . Diabetes mellitus without complication (HCC)   . Hypertension   . TIA (transient ischemic attack)     Patient Active Problem List   Diagnosis Date Noted  . LVH (left ventricular hypertrophy) 06/15/2013  . Pulmonary nodules/lesions, multiple 06/15/2013  . Hypertension 06/14/2013  . Possible Acute pericarditis, unspecified 06/14/2013  . Chest pain 06/13/2013    History reviewed. No pertinent surgical history.  OB History    No data available       Home Medications    Prior to Admission medications   Medication Sig Start Date End Date Taking? Authorizing Provider  escitalopram (LEXAPRO) 10 MG tablet Take 10 mg by mouth daily.    [provider]  fluticasone (FLONASE) 50 MCG/ACT nasal spray 1 to 2 sprays each nostril as needed 07/10/13   [provider]  GARLIC PO Take 2 capsules by mouth daily.    [provider]  ibuprofen (ADVIL,MOTRIN) 800 MG tablet Take 1 tablet (800 mg total) by mouth 3 (three) times daily. With food 06/15/13   Drema Dallas, MD  Iron TABS Take 65 mg by mouth daily.    [provider]  LORazepam (ATIVAN) 1 MG tablet Take 1 mg by mouth 2 (two) times daily as needed for anxiety.     [provider]  metoprolol tartrate (LOPRESSOR) 25 MG tablet Take 1 tablet (25 mg total) by mouth 2 (two) times daily. 06/15/13   Drema Dallas, MD  Multiple Vitamin (MULTIVITAMIN WITH MINERALS) TABS tablet Take 1 tablet by mouth daily.    [provider]  olmesartan-hydrochlorothiazide (BENICAR  HCT) 40-25 MG per tablet Take 1 tablet by mouth daily.    [provider]    Family History Family History  Problem Relation Age of Onset  . Colon cancer Brother   . Colon cancer Brother   . Lung cancer Sister        never smoked  . Heart disease Mother   . Breast cancer Mother   . Heart attack Sister   . Heart disease Brother   . Breast cancer Sister     Social History Social History  Substance Use Topics  . Smoking status: Never Smoker  . Smokeless tobacco: Never Used  . Alcohol use No     Allergies   Patient has no known allergies.   Review of Systems Review of Systems  Respiratory: Negative for chest tightness and shortness of breath.   Cardiovascular: Negative for chest pain.  Neurological: Negative for syncope, weakness, light-headedness and headaches.  All other systems reviewed and are negative.    Physical Exam Updated Vital Signs BP 134/62   Pulse 69   Temp (!) 97.5 F (36.4 C) (Oral)   Resp 16   SpO2 99%   Physical Exam  Constitutional: She is oriented to person, place, and time. She appears well-developed and well-nourished. No distress.  HENT:  Head: Normocephalic.  Eyes: EOM are normal.  Neck: Neck supple.  Cardiovascular: Normal rate  and regular rhythm.   Pulmonary/Chest: Effort normal and breath sounds normal.  Musculoskeletal: Normal range of motion.  Neurological: She is alert and oriented to person, place, and time.  Skin: Skin is warm and dry.  Psychiatric: She has a normal mood and affect.  Nursing note and vitals reviewed.    ED Treatments / Results  Labs (all labs ordered are listed, but only abnormal results are displayed) Labs Reviewed  BASIC METABOLIC PANEL - Abnormal; Notable for the following:       Result Value   Glucose, Bld 147 (*)    All other components within normal limits  CBC - Abnormal; Notable for the following:    RBC 3.71 (*)    Hemoglobin 11.1 (*)    HCT 34.2 (*)    All other components  within normal limits    EKG  EKG Interpretation  Date/Time:  Sunday January 24 2017 13:35:21 EDT Ventricular Rate:  64 PR Interval:    QRS Duration: 106 QT Interval:  431 QTC Calculation: 445 R Axis:   75 Text Interpretation:  Sinus rhythm No significant change since last tracing Confirmed by Doug Sou [id number]) on 01/24/2017 2:59:50 PM       Radiology No results found.  Procedures Procedures (including critical care time)  Medications Ordered in ED Medications - No data to display   Initial Impression / Assessment and Plan / ED Course  I have reviewed the triage vital signs and the nursing notes. 76 y.o. female here for repeat blood work after a call from her Urgent Care stating she had abnormal potassium level. Patient stable for d/c with normal K and she has no symptoms.  Dr. Ethelda Chick in to see the patient.   Final Clinical Impressions(s) / ED Diagnoses   Final diagnoses:  Laboratory test    New Prescriptions Discharge Medication List as of 01/24/2017  3:01 PM       Kerrie Buffalo Rivereno, NP 01/24/17 1621    Doug Sou, MD 01/24/17 1728

## 2017-01-24 NOTE — ED Triage Notes (Signed)
Pt reports she went to an UC yesterday where she had blood work drawn for a sore throat. Pt reports she got a phone call from the UC which said she had a potassium of 22. Pt reports she feels fine and has no physical complaints.

## 2017-01-24 NOTE — Discharge Instructions (Signed)
Your blood work to test your potassium today is normal. Follow up with your doctor or return here a needed for problems.

## 2017-09-02 ENCOUNTER — Other Ambulatory Visit: Payer: Self-pay | Admitting: Internal Medicine

## 2017-09-02 DIAGNOSIS — M5414 Radiculopathy, thoracic region: Secondary | ICD-10-CM

## 2017-09-05 ENCOUNTER — Ambulatory Visit
Admission: RE | Admit: 2017-09-05 | Discharge: 2017-09-05 | Disposition: A | Discharge status: Home / Self Care | Payer: Medicare Other | Source: Ambulatory Visit | Attending: Internal Medicine | Admitting: Internal Medicine

## 2017-09-05 DIAGNOSIS — M5414 Radiculopathy, thoracic region: Secondary | ICD-10-CM

## 2017-12-05 ENCOUNTER — Emergency Department (HOSPITAL_COMMUNITY)
Admission: EM | Admit: 2017-12-05 | Discharge: 2017-12-05 | Disposition: A | Discharge status: Home / Self Care | Payer: Medicare Other | Attending: Emergency Medicine | Admitting: Emergency Medicine

## 2017-12-05 ENCOUNTER — Emergency Department (HOSPITAL_COMMUNITY): Payer: Medicare Other

## 2017-12-05 ENCOUNTER — Encounter (HOSPITAL_COMMUNITY): Payer: Self-pay | Admitting: Emergency Medicine

## 2017-12-05 DIAGNOSIS — R079 Chest pain, unspecified: Secondary | ICD-10-CM | POA: Diagnosis present

## 2017-12-05 DIAGNOSIS — E119 Type 2 diabetes mellitus without complications: Secondary | ICD-10-CM | POA: Insufficient documentation

## 2017-12-05 DIAGNOSIS — R0789 Other chest pain: Secondary | ICD-10-CM | POA: Diagnosis not present

## 2017-12-05 DIAGNOSIS — Z79899 Other long term (current) drug therapy: Secondary | ICD-10-CM | POA: Insufficient documentation

## 2017-12-05 DIAGNOSIS — I10 Essential (primary) hypertension: Secondary | ICD-10-CM | POA: Insufficient documentation

## 2017-12-05 LAB — BASIC METABOLIC PANEL
Anion gap: 10 (ref 5–15)
BUN: 12 mg/dL (ref 8–23)
CALCIUM: 9.3 mg/dL (ref 8.9–10.3)
CHLORIDE: 98 mmol/L (ref 98–111)
CO2: 27 mmol/L (ref 22–32)
CREATININE: 0.83 mg/dL (ref 0.44–1.00)
GFR calc non Af Amer: >60 mL/min (ref >60)
Glucose, Bld: 109 mg/dL — ABNORMAL HIGH (ref 70–99)
Potassium: 3.8 mmol/L (ref 3.5–5.1)
SODIUM: 135 mmol/L (ref 135–145)

## 2017-12-05 LAB — CBC
HCT: 37.7 % (ref 36.0–46.0)
Hemoglobin: 11.9 g/dL — ABNORMAL LOW (ref 12.0–15.0)
MCH: 30.1 pg (ref 26.0–34.0)
MCHC: 31.6 g/dL (ref 30.0–36.0)
MCV: 95.4 fL (ref 78.0–100.0)
PLATELETS: 230 10*3/uL (ref 150–400)
RBC: 3.95 MIL/uL (ref 3.87–5.11)
RDW: 14.3 % (ref 11.5–15.5)
WBC: 6.8 10*3/uL (ref 4.0–10.5)

## 2017-12-05 LAB — I-STAT TROPONIN, ED
TROPONIN I, POC: 0.01 ng/mL (ref 0.00–0.08)
TROPONIN I, POC: 0.01 ng/mL (ref 0.00–0.08)

## 2017-12-05 NOTE — ED Provider Notes (Signed)
[facility] Provider Note   CSN: [id number] Arrival date & time: 12/05/17  1746     History   Chief Complaint Chief Complaint  Patient presents with  . Chest Pain    HPI Melinda Mann is a 77 y.o. female.  77 yo F with a chief complaint of chest pain.  Started with the feeling that she was having trouble or painful swallowing.  Had a very mild cough.  Pain is worse with coughing sitting up in the bed.  Denies fevers or chills.  Denies ear pain.  Is vomiting or diarrhea.  Patient's been having chronic pain to her abdomen.  Going on for at least the past couple years.  Feels the right side of her abdomen gets bloated and then improves.  Chest pain is not exertional.  She has a history of hypertension, denies hyperlipidemia denies diabetes denies smoking history.  Denies family history of early MI.  Denies history of PE or DVT denies recent surgery denies history of cancer denies hemoptysis denies unilateral lower extremity edema.  The history is provided by the patient.  Chest Pain   This is a new problem. The current episode started 3 to 5 hours ago. The problem occurs constantly. The problem has not changed since onset.The pain is associated with coughing. The pain is present in the lateral region. The pain is at a severity of 4/10. The pain is moderate. The quality of the pain is described as brief. The pain does not radiate. Duration of episode(s) is 3 hours. Pertinent negatives include no dizziness, no fever, no headaches, no nausea, no palpitations, no shortness of breath and no vomiting. She has tried nothing for the symptoms. The treatment provided no relief.  Her past medical history is significant for hypertension.  Pertinent negatives for past medical history include no diabetes, no DVT, no hyperlipidemia, no MI and no PE.    Past Medical History:  Diagnosis Date  . Diabetes mellitus without complication (HCC)   . Hypertension   . TIA  (transient ischemic attack)     Patient Active Problem List   Diagnosis Date Noted  . LVH (left ventricular hypertrophy) 06/15/2013  . Pulmonary nodules/lesions, multiple 06/15/2013  . Hypertension 06/14/2013  . Possible Acute pericarditis, unspecified 06/14/2013  . Chest pain 06/13/2013    History reviewed. No pertinent surgical history.   OB History   None      Home Medications    Prior to Admission medications   Medication Sig Start Date End Date Taking? Authorizing Provider  escitalopram (LEXAPRO) 10 MG tablet Take 10 mg by mouth daily.    [provider]  fluticasone (FLONASE) 50 MCG/ACT nasal spray 1 to 2 sprays each nostril as needed 07/10/13   [provider]  GARLIC PO Take 2 capsules by mouth daily.    [provider]  ibuprofen (ADVIL,MOTRIN) 800 MG tablet Take 1 tablet (800 mg total) by mouth 3 (three) times daily. With food 06/15/13   Drema DallasWoods, Curtis J, MD  Iron TABS Take 65 mg by mouth daily.    [provider]  LORazepam (ATIVAN) 1 MG tablet Take 1 mg by mouth 2 (two) times daily as needed for anxiety.     [provider]  metoprolol tartrate (LOPRESSOR) 25 MG tablet Take 1 tablet (25 mg total) by mouth 2 (two) times daily. 06/15/13   Drema DallasWoods, Curtis J, MD  Multiple Vitamin (MULTIVITAMIN WITH MINERALS) TABS tablet Take 1 tablet by mouth  daily.    [provider]  olmesartan-hydrochlorothiazide (BENICAR HCT) 40-25 MG per tablet Take 1 tablet by mouth daily.    [provider]    Family History Family History  Problem Relation Age of Onset  . Colon cancer Brother   . Colon cancer Brother   . Lung cancer Sister        never smoked  . Heart disease Mother   . Breast cancer Mother   . Heart attack Sister   . Heart disease Brother   . Breast cancer Sister     Social History Social History   Tobacco Use  . Smoking status: Never Smoker  . Smokeless tobacco: Never Used  Substance Use Topics  . Alcohol  use: No  . Drug use: No     Allergies   Patient has no known allergies.   Review of Systems Review of Systems  Constitutional: Negative for chills and fever.  HENT: Negative for congestion and rhinorrhea.   Eyes: Negative for redness and visual disturbance.  Respiratory: Negative for shortness of breath and wheezing.   Cardiovascular: Positive for chest pain. Negative for palpitations.  Gastrointestinal: Negative for nausea and vomiting.  Genitourinary: Negative for dysuria and urgency.  Musculoskeletal: Negative for arthralgias and myalgias.  Skin: Negative for pallor and wound.  Neurological: Negative for dizziness and headaches.     Physical Exam Updated Vital Signs BP (!) 147/69   Pulse 89   Temp 98.9 F (37.2 C) (Oral)   Resp 20   Ht 5\' 6"  (1.676 m)   Wt 90.7 kg (200 lb)   SpO2 96%   BMI 32.28 kg/m   Physical Exam  Constitutional: She is oriented to person, place, and time. She appears well-developed and well-nourished. No distress.  HENT:  Head: Normocephalic and atraumatic.  Eyes: Pupils are equal, round, and reactive to light. EOM are normal.  Neck: Normal range of motion. Neck supple.  Cardiovascular: Normal rate and regular rhythm. Exam reveals no gallop and no friction rub.  No murmur heard. Pulmonary/Chest: Effort normal. She has no wheezes. She has no rales. She exhibits tenderness (tender about the right chest wall).  Abdominal: Soft. She exhibits no distension. There is no tenderness.  Musculoskeletal: She exhibits no edema or tenderness.  Neurological: She is alert and oriented to person, place, and time.  Skin: Skin is warm and dry. She is not diaphoretic.  Psychiatric: She has a normal mood and affect. Her behavior is normal.  Nursing note and vitals reviewed.    ED Treatments / Results  Labs (all labs ordered are listed, but only abnormal results are displayed) Labs Reviewed  BASIC METABOLIC PANEL - Abnormal; Notable for the following  components:      Result Value   Glucose, Bld 109 (*)    All other components within normal limits  CBC - Abnormal; Notable for the following components:   Hemoglobin 11.9 (*)    All other components within normal limits  I-STAT TROPONIN, ED  I-STAT TROPONIN, ED    EKG EKG Interpretation  Date/Time:  Sunday December 05 2017 17:53:31 EDT Ventricular Rate:  128 PR Interval:  150 QRS Duration: 98 QT Interval:  306 QTC Calculation: 446 R Axis:   82 Text Interpretation:  Sinus tachycardia with frequent Premature ventricular complexes and Fusion complexes T wave abnormality, consider inferior ischemia Abnormal ECG Since last tracing rate faster Otherwise no significant change Confirmed by Melene Plan [phone]) on 12/05/2017 6:27:35 PM   Radiology Dg Chest  2 View  Result Date: 12/05/2017 CLINICAL DATA:  Central chest pain beginning this morning. EXAM: CHEST - 2 VIEW COMPARISON:  Chest radiograph February 04, 2016 FINDINGS: Cardiac silhouette is upper limits of normal in size. Mildly calcified aortic arch. No pleural effusions or focal consolidation. Trachea projects midline and there is no pneumothorax. Soft tissue planes and included osseous structures are non-suspicious. Moderate degenerative change of the thoracic spine. IMPRESSION: 1. Mild cardiomegaly.  No acute pulmonary process. 2.  Aortic Atherosclerosis (ICD10-I70.0). Electronically Signed   By: Awilda Metro M.D.   On: 12/05/2017 18:45    Procedures Procedures (including critical care time)  Medications Ordered in ED Medications - No data to display   Initial Impression / Assessment and Plan / ED Course  I have reviewed the triage vital signs and the nursing notes.  Pertinent labs & imaging results that were available during my care of the patient were reviewed by me and considered in my medical decision making (see chart for details).     77 yo F with a chief complaints of right-sided chest pain.  This is atypical in nature  and reproduced with palpation of the chest wall.  Most likely this is muscular skeletal, to the patient's age and risk factors will obtain a delta troponin.  Initial troponin negative ECG without concerning finding.  No noted electrolyte abnormality.  Renal function to baseline.  She is mildly anemic  Delta Trop negative.  Discharge home.  9:47 PM:  I have discussed the diagnosis/risks/treatment options with the patient and family and believe the pt to be eligible for discharge home to follow-up with PCP. We also discussed returning to the ED immediately if new or worsening sx occur. We discussed the sx which are most concerning (e.g., sudden worsening pain, fever, inability to tolerate by mouth) that necessitate immediate return. Medications administered to the patient during their visit and any new prescriptions provided to the patient are listed below.  Medications given during this visit Medications - No data to display    The patient appears reasonably screen and/or stabilized for discharge and I doubt any other medical condition or other [facility] requiring further screening, evaluation, or treatment in the ED at this time prior to discharge.    Final Clinical Impressions(s) / ED Diagnoses   Final diagnoses:  Atypical chest pain    ED Discharge Orders    None       Melene Plan, DO 12/05/17 2147

## 2017-12-05 NOTE — ED Triage Notes (Signed)
Pt presents with central CP that started this morning at 4am with radiation to R neck, "feels full"; pt pain worse with deep breathing, headache, sore throat also

## 2017-12-05 NOTE — Discharge Instructions (Signed)
Take tylenol 1000mg(2 extra strength) four times a day.  ° °

## 2018-01-14 ENCOUNTER — Ambulatory Visit: Payer: Medicare Other | Admitting: Physician Assistant

## 2018-02-28 ENCOUNTER — Encounter: Payer: Self-pay | Admitting: Cardiovascular Disease

## 2018-02-28 ENCOUNTER — Ambulatory Visit: Payer: Medicare Other | Admitting: Cardiovascular Disease

## 2018-02-28 VITALS — BP 140/80 | HR 98 | Ht 66.0 in | Wt 195.8 lb

## 2018-02-28 DIAGNOSIS — R0602 Shortness of breath: Secondary | ICD-10-CM | POA: Diagnosis not present

## 2018-02-28 DIAGNOSIS — I10 Essential (primary) hypertension: Secondary | ICD-10-CM

## 2018-02-28 DIAGNOSIS — I499 Cardiac arrhythmia, unspecified: Secondary | ICD-10-CM

## 2018-02-28 DIAGNOSIS — R0789 Other chest pain: Secondary | ICD-10-CM

## 2018-02-28 DIAGNOSIS — Z13220 Encounter for screening for lipoid disorders: Secondary | ICD-10-CM | POA: Diagnosis not present

## 2018-02-28 DIAGNOSIS — E669 Obesity, unspecified: Secondary | ICD-10-CM

## 2018-02-28 DIAGNOSIS — I498 Other specified cardiac arrhythmias: Secondary | ICD-10-CM

## 2018-02-28 MED ORDER — METOPROLOL TARTRATE 50 MG PO TABS: 50.0000 mg | ORAL_TABLET | Freq: Two times a day (BID) | ORAL | 3 refills | Status: DC

## 2018-02-28 NOTE — Progress Notes (Signed)
Cardiology Office Note    Date:  03/07/2018   ID:  Melinda Mann, DOB 31-Aug-1940, MRN [id number]  PCP:  Patient, No Pcp Per  Cardiologist:  Nicki Guadalajara, MD   New cardiology evaluation referred through the courtesy of Dr. Selena Batten for evaluation of recent chest pain and emergency room visit.  History of Present Illness:  Melinda Mann is a 77 y.o. female who was evaluated in the ER for chest pain and is referred by Dr. Selena Batten for cardiology evaluation.  Melinda Mann has a history of hypertension for at least 30 years.  I had taken care of her husband for many years Melinda Mann) passed away approximately 5 years ago.  Patient denies any history of documented coronary artery disease.  In August 2019, she was evaluated in the [facility] emergency room with complaints of chest pain.  At that time she had noticed that she was having some pain with swallowing and had a mild cough.  She admitted to abdominal bloating.  Her chest pain was nonexertional.  Her ECG during that evaluation showed sinus tachycardia with frequent PVCs with a ventricular rate of 128 bpm.  Troponins were negative.  She was seen in follow-up at [facility] and is referred to me for further cardiology evaluation.  The patient states that she has had pain in her right upper quadrant for over 4 years but ultimately this has improved.  She denies exertional chest pain symptomatology.  She had midst to some episodes of shortness of breath.  She occasionally walks but not for long duration and denies exertional precipitation.  She denies presyncope or syncope.  She denies PND orthopnea.  She has been on Benicar HCT 40/25 mg metoprolol 25 mg twice a day and hydrochlorothiazide for hypertension.  She also has a history of depression and is on Lexapro.  She presents for evaluation.  Past Medical History:  Diagnosis Date  . Diabetes mellitus without complication (HCC)   . Hypertension   . TIA (transient ischemic attack)     No  past surgical history on file.  Current Medications: Outpatient Medications Prior to Visit  Medication Sig Dispense Refill  . escitalopram (LEXAPRO) 10 MG tablet Take 10 mg by mouth daily.    . fluticasone (FLONASE) 50 MCG/ACT nasal spray 1 to 2 sprays each nostril as needed    . folic acid (FOLVITE) 1 MG tablet Take 1 mg by mouth daily.  1  . GARLIC PO Take 2 capsules by mouth daily.    . hydrochlorothiazide (HYDRODIURIL) 12.5 MG tablet Take 12.5 mg by mouth daily.  2  . ibuprofen (ADVIL,MOTRIN) 800 MG tablet Take 1 tablet (800 mg total) by mouth 3 (three) times daily. With food 90 tablet 3  . Iron TABS Take 65 mg by mouth daily.    Marland Kitchen LORazepam (ATIVAN) 1 MG tablet Take 1 mg by mouth 2 (two) times daily as needed for anxiety.     . Multiple Vitamin (MULTIVITAMIN WITH MINERALS) TABS tablet Take 1 tablet by mouth daily.    Marland Kitchen olmesartan-hydrochlorothiazide (BENICAR HCT) 40-25 MG per tablet Take 1 tablet by mouth daily.    . valsartan (DIOVAN) 320 MG tablet Take 320 mg by mouth daily.  0  . metoprolol tartrate (LOPRESSOR) 25 MG tablet Take 1 tablet (25 mg total) by mouth 2 (two) times daily. 60 tablet 0   No facility-administered medications prior to visit.      Allergies:   Patient has no known allergies.  Social History   Socioeconomic History  . Marital status: Widowed    Spouse name: Not on file  . Number of children: Not on file  . Years of education: Not on file  . Highest education level: Not on file  Occupational History  . Occupation: Retired- Haematologist  Social Needs  . Financial resource strain: Not on file  . Food insecurity:    Worry: Not on file    Inability: Not on file  . Transportation needs:    Medical: Not on file    Non-medical: Not on file  Tobacco Use  . Smoking status: Never Smoker  . Smokeless tobacco: Never Used  Substance and Sexual Activity  . Alcohol use: No  . Drug use: No  . Sexual activity: Not on file  Lifestyle  . Physical activity:     Days per week: Not on file    Minutes per session: Not on file  . Stress: Not on file  Relationships  . Social connections:    Talks on phone: Not on file    Gets together: Not on file    Attends religious service: Not on file    Active member of club or organization: Not on file    Attends meetings of clubs or organizations: Not on file    Relationship status: Not on file  Other Topics Concern  . Not on file  Social History Narrative  . Not on file    Additional social history is notable in that she is widowed for 5 years.  She lives with her daughter.  She previously was a Haematologist at [employer].  She completed 12th grade.  She is retired.  There is no tobacco or alcohol use.  She does not use illicit drugs.  Family History:  The patient's family history includes Breast cancer in her mother and sister; Colon cancer in her brother and brother; Heart attack in her sister; Heart disease in her brother and mother; Lung cancer in her sister.   Mother died at age 37 and had heart trouble and a CA.  Father died of natural causes at age 48.  2 brothers died with colon cancer and one sister died with breast and lung cancer.  ROS General: Negative; No fevers, chills, or night sweats;  HEENT: Negative; No changes in vision or hearing, sinus congestion, difficulty swallowing Pulmonary: Negative; No cough, wheezing, shortness of breath, hemoptysis Cardiovascular: Negative; No chest pain, presyncope, syncope, palpitations GI: 3 of intermittent abdominal pains. GU: Negative; No dysuria, hematuria, or difficulty voiding Musculoskeletal: Negative; no myalgias, joint pain, or weakness Hematologic/Oncology: Negative; no easy bruising, bleeding Endocrine: Negative; no heat/cold intolerance; no diabetes Neuro: Negative; no changes in balance, headaches Skin: Negative; No rashes or skin lesions Psychiatric: Negative; No behavioral problems, depression Sleep: Negative; No snoring, daytime sleepiness,  hypersomnolence, bruxism, restless legs, hypnogognic hallucinations, no cataplexy Other comprehensive 14 point system review is negative.   PHYSICAL EXAM:   VS:  BP 140/80   Pulse 98   Ht 5\' 6"  (1.676 m)   Wt 195 lb 12.8 oz (88.8 kg)   BMI 31.60 kg/m     Repeat blood pressure by me was 170/86.  Wt Readings from Last 3 Encounters:  02/28/18 195 lb 12.8 oz (88.8 kg)  12/05/17 200 lb (90.7 kg)  08/22/13 209 lb 9.6 oz (95.1 kg)    General: Alert, oriented, no distress.  Skin: normal turgor, no rashes, warm and dry HEENT: Normocephalic, atraumatic. Pupils equal round and  reactive to light; sclera anicteric; extraocular muscles intact; Fundi mild arteriolar narrowing.  No hemorrhages or exudates.  Discs flat. Nose without nasal septal hypertrophy Mouth/Parynx benign; Mallinpatti scale Neck: No JVD, no carotid bruits; normal carotid upstroke Lungs: clear to ausculatation and percussion; no wheezing or rales Chest wall: without tenderness to palpitation Heart: PMI not displaced, RRR, s1 s2 normal, 1/6 systolic murmur, no diastolic murmur, no rubs, gallops, thrills, or heaves Abdomen: soft, nontender; no hepatosplenomehaly, BS+; abdominal aorta nontender and not dilated by palpation. Back: no CVA tenderness Pulses 2+ Musculoskeletal: full range of motion, normal strength, no joint deformities Extremities: no clubbing cyanosis or edema, Homan's sign negative  Neurologic: grossly nonfocal; Cranial nerves grossly wnl Psychologic: Normal mood and affect   Studies/Labs Reviewed:   EKG:  EKG is ordered today.  ECG (independently read by me): Sinus rhythm at 98 bpm with frequent PVCs and a transient by bigeminal rhythm.  Poor anterior R wave progression.  PVCs have a right bundle branch block morphology.  PR interval 188 ms, QTc interval 497 ms.  Recent Labs: BMP Latest Ref Rng & Units 12/05/2017 01/24/2017 06/13/2013  Glucose 70 - 99 mg/dL 440(N) 027(O) 536(U)  BUN 8 - 23 mg/dL 12 17 11     Creatinine 0.44 - 1.00 mg/dL 4.40 3.47 4.25  Sodium 135 - 145 mmol/L 135 139 138  Potassium 3.5 - 5.1 mmol/L 3.8 3.6 3.4(L)  Chloride 98 - 111 mmol/L 98 105 99  CO2 22 - 32 mmol/L 27 24 28   Calcium 8.9 - 10.3 mg/dL 9.3 9.3 8.8     No flowsheet data found.  CBC Latest Ref Rng & Units 12/05/2017 01/24/2017 06/13/2013  WBC 4.0 - 10.5 K/uL 6.8 7.1 7.8  Hemoglobin 12.0 - 15.0 g/dL 11.9(L) 11.1(L) 11.6(L)  Hematocrit 36.0 - 46.0 % 37.7 34.2(L) 36.1  Platelets 150 - 400 K/uL 230 224 218   Lab Results  Component Value Date   MCV 95.4 12/05/2017   MCV 92.2 01/24/2017   MCV 92.1 06/13/2013   No results found for: TSH No results found for: HGBA1C   BNP No results found for: BNP  ProBNP No results found for: PROBNP   Lipid Panel  No results found for: CHOL, TRIG, HDL, CHOLHDL, VLDL, LDLCALC, LDLDIRECT   RADIOLOGY: No results found.   Additional studies/ records that were reviewed today include:   I reviewed the records from the emergency room as well as [facility] medical.  ASSESSMENT:    1. SOB (shortness of breath)   2. Hypertension, unspecified type   3. Ventricular bigeminy   4. Lipid screening   5. Mild obesity   6. Atypical chest pain     PLAN:  Melinda Mann is a 77 year old widowed female who was the wife of my former patient Mr. Rennae Ferraiolo.  Melinda Mann has at least a 30-year history of hypertension and has recently developed some atypical chest pain symptomatology.  She admits to shortness of breath.  She denies any exertional symptoms.  Her blood pressure today is elevated and on recheck by me was 170/86.  Her pulse was in the upper 90s and she had frequent PVCs and a transient bigeminal rhythm.  She has not had recent laboratory with the exception of renal function.  I am recommending a complete set of fasting laboratory including chemistry, CBC, TSH, magnesium, lipid studies, and BMP.  I am recommending titration of Lopressor to 50 mg twice a day which will be  helpful both for blood pressure  control as well as DVC suppression.  I am scheduling her for 2D echo Doppler study to evaluate systolic and diastolic function.  She is mildly obese with a BMI of 31.6.  Weight loss was recommended.  I will see her back in the office in 4 weeks and further recommendations will be made at that time.   Medication Adjustments/Labs and Tests Ordered: Current medicines are reviewed at length with the patient today.  Concerns regarding medicines are outlined above.  Medication changes, Labs and Tests ordered today are listed in the Patient Instructions below. Patient Instructions  Medication Instructions:  INCREASE metoprolol tartrate (Lopressor) to 50 mg two times daily  If you need a refill on your cardiac medications before your next appointment, please call your pharmacy.   Lab work: Please return for FASTING labs  (CMET, CBC, Lipid, TSH, Mag, BNP)  Our in office lab hours are Monday-Friday 8:00-4:00, closed for lunch 12:45-1:45 pm.  No appointment needed.  If you have labs (blood work) drawn today and your tests are completely normal, you will receive your results only by: Marland Kitchen MyChart Message (if you have MyChart) OR . A paper copy in the mail If you have any lab test that is abnormal or we need to change your treatment, we will call you to review the results.  Testing/Procedures: Your physician has requested that you have an echocardiogram. Echocardiography is a painless test that uses sound waves to create images of your heart. It provides your doctor with information about the size and shape of your heart and how well your heart's chambers and valves are working. This procedure takes approximately one hour. There are no restrictions for this procedure.  This will be done at our [facility] location:  [address]  Follow-Up: Monday 12/2 at 4 pm with Dr. Tresa Endo       Signed, Nicki Guadalajara, MD  03/07/2018 6:13 PM    [facility] [address], [place], [place]  [address] Phone: [phone]

## 2018-02-28 NOTE — Patient Instructions (Signed)
Medication Instructions:  INCREASE metoprolol tartrate (Lopressor) to 50 mg two times daily  If you need a refill on your cardiac medications before your next appointment, please call your pharmacy.   Lab work: Please return for FASTING labs  (CMET, CBC, Lipid, TSH, Mag, BNP)  Our in office lab hours are Monday-Friday 8:00-4:00, closed for lunch 12:45-1:45 pm.  No appointment needed.  If you have labs (blood work) drawn today and your tests are completely normal, you will receive your results only by: Marland Kitchen MyChart Message (if you have MyChart) OR . A paper copy in the mail If you have any lab test that is abnormal or we need to change your treatment, we will call you to review the results.  Testing/Procedures: Your physician has requested that you have an echocardiogram. Echocardiography is a painless test that uses sound waves to create images of your heart. It provides your doctor with information about the size and shape of your heart and how well your heart's chambers and valves are working. This procedure takes approximately one hour. There are no restrictions for this procedure.  This will be done at our [facility] location:  [address]  Follow-Up: Monday 12/2 at 4 pm with Dr. Tresa Endo

## 2018-03-04 ENCOUNTER — Other Ambulatory Visit (HOSPITAL_COMMUNITY): Payer: Medicare Other

## 2018-03-04 DIAGNOSIS — R0989 Other specified symptoms and signs involving the circulatory and respiratory systems: Secondary | ICD-10-CM

## 2018-03-07 ENCOUNTER — Encounter: Payer: Self-pay | Admitting: Cardiovascular Disease

## 2018-03-15 LAB — COMPREHENSIVE METABOLIC PANEL
A/G RATIO: 1.6 (ref 1.2–2.2)
ALBUMIN: 4.1 g/dL (ref 3.5–4.8)
ALK PHOS: 96 IU/L (ref 39–117)
ALT: 17 IU/L (ref 0–32)
AST: 29 IU/L (ref 0–40)
BUN/Creatinine Ratio: 14 (ref 12–28)
BUN: 10 mg/dL (ref 8–27)
Bilirubin Total: 0.4 mg/dL (ref 0.0–1.2)
CALCIUM: 9.2 mg/dL (ref 8.7–10.3)
CO2: 23 mmol/L (ref 20–29)
CREATININE: 0.73 mg/dL (ref 0.57–1.00)
Chloride: 104 mmol/L (ref 96–106)
GFR calc Af Amer: 93 mL/min/{1.73_m2} (ref >59)
GFR calc non Af Amer: 81 mL/min/{1.73_m2} (ref >59)
GLOBULIN, TOTAL: 2.5 g/dL (ref 1.5–4.5)
Glucose: 129 mg/dL — ABNORMAL HIGH (ref 65–99)
Potassium: 4.3 mmol/L (ref 3.5–5.2)
Sodium: 144 mmol/L (ref 134–144)
Total Protein: 6.6 g/dL (ref 6.0–8.5)

## 2018-03-15 LAB — CBC
HEMATOCRIT: 33.9 % — AB (ref 34.0–46.6)
HEMOGLOBIN: 11.4 g/dL (ref 11.1–15.9)
MCH: 29.4 pg (ref 26.6–33.0)
MCHC: 33.6 g/dL (ref 31.5–35.7)
MCV: 87 fL (ref 79–97)
PLATELETS: 283 10*3/uL (ref 150–450)
RBC: 3.88 x10E6/uL (ref 3.77–5.28)
RDW: 13.1 % (ref 12.3–15.4)
WBC: 5.8 10*3/uL (ref 3.4–10.8)

## 2018-03-15 LAB — LIPID PANEL
Chol/HDL Ratio: 2.7 ratio (ref 0.0–4.4)
Cholesterol, Total: 107 mg/dL (ref 100–199)
HDL: 40 mg/dL (ref >39)
LDL CALC: 51 mg/dL (ref 0–99)
Triglycerides: 79 mg/dL (ref 0–149)
VLDL CHOLESTEROL CAL: 16 mg/dL (ref 5–40)

## 2018-03-15 LAB — BRAIN NATRIURETIC PEPTIDE: BNP: 246.1 pg/mL — ABNORMAL HIGH (ref 0.0–100.0)

## 2018-03-15 LAB — TSH: TSH: 2.58 u[IU]/mL (ref 0.450–4.500)

## 2018-03-15 LAB — MAGNESIUM: Magnesium: 1.8 mg/dL (ref 1.6–2.3)

## 2018-03-18 ENCOUNTER — Ambulatory Visit (HOSPITAL_COMMUNITY): Payer: Medicare Other | Attending: Cardiovascular Disease

## 2018-03-18 ENCOUNTER — Other Ambulatory Visit: Payer: Self-pay

## 2018-03-18 DIAGNOSIS — R0602 Shortness of breath: Secondary | ICD-10-CM | POA: Diagnosis not present

## 2018-04-04 ENCOUNTER — Ambulatory Visit (INDEPENDENT_AMBULATORY_CARE_PROVIDER_SITE_OTHER): Payer: Medicare Other | Admitting: Cardiovascular Disease

## 2018-04-04 ENCOUNTER — Encounter (INDEPENDENT_AMBULATORY_CARE_PROVIDER_SITE_OTHER): Payer: Self-pay

## 2018-04-04 ENCOUNTER — Encounter: Payer: Self-pay | Admitting: Cardiovascular Disease

## 2018-04-04 VITALS — BP 176/84 | HR 91 | Ht 66.0 in | Wt 194.8 lb

## 2018-04-04 DIAGNOSIS — Z79899 Other long term (current) drug therapy: Secondary | ICD-10-CM

## 2018-04-04 DIAGNOSIS — I5189 Other ill-defined heart diseases: Secondary | ICD-10-CM

## 2018-04-04 DIAGNOSIS — R0602 Shortness of breath: Secondary | ICD-10-CM | POA: Diagnosis not present

## 2018-04-04 DIAGNOSIS — I493 Ventricular premature depolarization: Secondary | ICD-10-CM | POA: Diagnosis not present

## 2018-04-04 DIAGNOSIS — I10 Essential (primary) hypertension: Secondary | ICD-10-CM

## 2018-04-04 DIAGNOSIS — M25473 Effusion, unspecified ankle: Secondary | ICD-10-CM

## 2018-04-04 DIAGNOSIS — I519 Heart disease, unspecified: Secondary | ICD-10-CM

## 2018-04-04 MED ORDER — METOPROLOL TARTRATE 75 MG PO TABS: 75.0000 mg | ORAL_TABLET | Freq: Two times a day (BID) | ORAL | 3 refills | Status: DC

## 2018-04-04 MED ORDER — SPIRONOLACTONE 25 MG PO TABS: 12.5000 mg | ORAL_TABLET | Freq: Every day | ORAL | 3 refills | Status: DC

## 2018-04-04 NOTE — Progress Notes (Signed)
Cardiology Office Note    Date:  04/05/2018   ID:  Melinda Mann, Melinda Mann Melinda Mann, Melinda Mann, MRN [id number]  PCP:  Janie Morning, DO  Cardiologist:  Shelva Majestic, MD   F/U cardiology evaluation referred through the courtesy of Dr. Maudie Mercury for evaluation of recent chest pain and emergency room visit.  History of Present Illness:  Melinda Mann is a 77 y.o. female who was evaluated in the ER for chest pain and was referred by Dr. Maudie Mercury for cardiology evaluation.  I saw her for initial evaluation on February 28, 2018.  She presents for follow-up evaluation.  Melinda Mann has a history of hypertension for at least 30 years.  I had taken care of her husband for many years Shaili Donalson) passed away approximately 5 years ago.  Patient denies any history of documented coronary artery disease.  In Melinda 2019, she was evaluated in the [facility] emergency room with complaints of chest pain.  At that time she had noticed that she was having some pain with swallowing and had a mild cough.  She admitted to abdominal bloating.  Her chest pain was nonexertional.  Her ECG during that evaluation showed sinus tachycardia with frequent PVCs with a ventricular rate of 128 bpm.  Troponins were negative.  She was seen in follow-up at [facility] and was referred to me for further cardiology evaluation.  The patient states that she has had pain in her right upper quadrant for over 4 years but ultimately this has improved.  She denied exertional chest pain symptomatology.  She had  some episodes of shortness of breath.  She occasionally walks but not for long duration and denies exertional precipitation.  She denied presyncope or syncope,  PND orthopnea.  She has been on Benicar HCT 40/25 mg metoprolol 25 mg twice a day and hydrochlorothiazide for hypertension.  She also has a history of depression and is on Lexapro.   When I initially saw her, I titrated her metoprolol to 50 mg twice a day as her blood pressure was  elevated by me at 170/86 she was having frequent PVCs with transient bigeminal rhythm.  I scheduled her for an echo Doppler study which was done on March 18, 2018 and showed an EF of 60 to 65%.  There was grade 1 diastolic dysfunction.  Doppler parameters were consistent with high ventricular filling pressure.  There was moderate aortic insufficiency.  There was mild increased PA pressure 35 mm.  Her palpitations have improved with her increased beta-blocker regimen.  She underwent laboratory which showed a total cholesterol 107, triglycerides 79, HDL 40, LDL 51.  She has noticed some trace to mild ankle edema.  She has been on hydrochlorothiazide 12.5 mg daily, valsartan 320 mg daily, and metoprolol 50 mg twice a day.  She presents for evaluation.  Past Medical History:  Diagnosis Date  . Diabetes mellitus without complication ([place])   . Hypertension   . TIA (transient ischemic attack)     No past surgical history on file.  Current Medications: Outpatient Medications Prior to Visit  Medication Sig Dispense Refill  . escitalopram (LEXAPRO) 10 MG tablet Take 10 mg by mouth daily.    . fluticasone (FLONASE) 50 MCG/ACT nasal spray 1 to 2 sprays each nostril as needed    . folic acid (FOLVITE) 1 MG tablet Take 1 mg by mouth daily.  1  . GARLIC PO Take 2 capsules by mouth daily.    . hydrochlorothiazide (HYDRODIURIL) 12.5 MG  tablet Take 12.5 mg by mouth daily.  2  . ibuprofen (ADVIL,MOTRIN) 800 MG tablet Take 1 tablet (800 mg total) by mouth 3 (three) times daily. With food 90 tablet 3  . Iron TABS Take 65 mg by mouth daily.    Marland Kitchen LORazepam (ATIVAN) 1 MG tablet Take 1 mg by mouth 2 (two) times daily as needed for anxiety.     . Multiple Vitamin (MULTIVITAMIN WITH MINERALS) TABS tablet Take 1 tablet by mouth daily.    . valsartan (DIOVAN) 320 MG tablet Take 320 mg by mouth daily.  0  . metoprolol tartrate (LOPRESSOR) 50 MG tablet Take 1 tablet (50 mg total) by mouth 2 (two) times daily. 180 tablet  3  . olmesartan-hydrochlorothiazide (BENICAR HCT) 40-25 MG per tablet Take 1 tablet by mouth daily.     No facility-administered medications prior to visit.      Allergies:   Patient has no known allergies.   Social History   Socioeconomic History  . Marital status: Widowed    Spouse name: Not on file  . Number of children: Not on file  . Years of education: Not on file  . Highest education level: Not on file  Occupational History  . Occupation: Retired- Secretary/administrator  Social Needs  . Financial resource strain: Not on file  . Food insecurity:    Worry: Not on file    Inability: Not on file  . Transportation needs:    Medical: Not on file    Non-medical: Not on file  Tobacco Use  . Smoking status: Never Smoker  . Smokeless tobacco: Never Used  Substance and Sexual Activity  . Alcohol use: No  . Drug use: No  . Sexual activity: Not on file  Lifestyle  . Physical activity:    Days per week: Not on file    Minutes per session: Not on file  . Stress: Not on file  Relationships  . Social connections:    Talks on phone: Not on file    Gets together: Not on file    Attends religious service: Not on file    Active member of club or organization: Not on file    Attends meetings of clubs or organizations: Not on file    Relationship status: Not on file  Other Topics Concern  . Not on file  Social History Narrative  . Not on file    Additional social history is notable in that she is widowed for 5 years.  She lives with her daughter.  She previously was a Secretary/administrator at [employer].  She completed 12th grade.  She is retired.  There is no tobacco or alcohol use.  She does not use illicit drugs.  Family History:  The patient's family history includes Breast cancer in her mother and sister; Colon cancer in her brother and brother; Heart attack in her sister; Heart disease in her brother and mother; Lung cancer in her sister.   Mother died at age 2 and had heart trouble and a CA.   Father died of natural causes at age 75.  2 brothers died with colon cancer and one sister died with breast and lung cancer.  ROS General: Negative; No fevers, chills, or night sweats;  HEENT: Negative; No changes in vision or hearing, sinus congestion, difficulty swallowing Pulmonary: Negative; No cough, wheezing, shortness of breath, hemoptysis Cardiovascular: Palpitations improved, intermittent ankle swelling GI: 3 of intermittent abdominal pains. GU: Negative; No dysuria, hematuria, or difficulty voiding Musculoskeletal:  Negative; no myalgias, joint pain, or weakness Hematologic/Oncology: Negative; no easy bruising, bleeding Endocrine: Negative; no heat/cold intolerance; no diabetes Neuro: Negative; no changes in balance, headaches Skin: Negative; No rashes or skin lesions Psychiatric: Negative; No behavioral problems, depression Sleep: Negative; No snoring, daytime sleepiness, hypersomnolence, bruxism, restless legs, hypnogognic hallucinations, no cataplexy Other comprehensive 14 point system review is negative.   PHYSICAL EXAM:   VS:  BP (!) 176/84   Pulse 91   Ht '5\' 6"'$  (1.676 m)   Wt 194 lb 12.8 oz (88.4 kg)   BMI 31.44 kg/m     Repeat blood pressure by me was 160/84  Wt Readings from Last 3 Encounters:  04/04/18 194 lb 12.8 oz (88.4 kg)  02/28/18 195 lb 12.8 oz (88.8 kg)  12/05/17 200 lb (90.7 kg)    General: Alert, oriented, no distress.  Skin: normal turgor, no rashes, warm and dry HEENT: Normocephalic, atraumatic. Pupils equal round and reactive to light; sclera anicteric; extraocular muscles intact;  Nose without nasal septal hypertrophy Mouth/Parynx benign; Mallinpatti scale 3 Neck: No JVD, no carotid bruits; normal carotid upstroke Lungs: clear to ausculatation and percussion; no wheezing or rales Chest wall: without tenderness to palpitation Heart: PMI not displaced, RRR rate in the 90s with rare to occasional ectopy, s1 s2 normal, 1/6 systolic murmur, no  diastolic murmur, no rubs, gallops, thrills, or heaves Abdomen: soft, nontender; no hepatosplenomehaly, BS+; abdominal aorta nontender and not dilated by palpation. Back: no CVA tenderness Pulses 2+ Musculoskeletal: full range of motion, normal strength, no joint deformities Extremities: Trace to 1+ bilateral ankle edema; no clubbing cyanosis, Homan's sign negative  Neurologic: grossly nonfocal; Cranial nerves grossly wnl Psychologic: Normal mood and affect   Studies/Labs Reviewed:   EKG:  EKG is ordered today.  ECG (independently read by me): Normal sinus rhythm at 91 bpm.  First-degree AV block with a PR interval of 204 ms.  Increased QTc interval at 496 ms.  Occasional PVC  February 28, 2018 ECG (independently read by me): Sinus rhythm at 98 bpm with frequent PVCs and a transient by bigeminal rhythm.  Poor anterior R wave progression.  PVCs have a right bundle branch block morphology.  PR interval 188 ms, QTc interval 497 ms.  Recent Labs: BMP Latest Ref Rng & Units 03/14/2018 12/05/2017 9/Mann/2018  Glucose 65 - 99 mg/dL 129(H) 109(H) 147(H)  BUN 8 - 27 mg/dL '10 12 17  '$ Creatinine 0.57 - 1.00 mg/dL 0.73 0.83 0.74  BUN/Creat Ratio 12 - 28 14 - -  Sodium 134 - 144 mmol/L 144 135 139  Potassium 3.5 - 5.2 mmol/L 4.3 3.8 3.6  Chloride 96 - 106 mmol/L 104 98 105  CO2 20 - 29 mmol/L 'Mann 27 24  '$ Calcium 8.7 - 10.3 mg/dL 9.2 9.3 9.3     Hepatic Function Latest Ref Rng & Units 03/14/2018  Total Protein 6.0 - 8.5 g/dL 6.6  Albumin 3.5 - 4.8 g/dL 4.1  AST 0 - 40 IU/L 29  ALT 0 - 32 IU/L 17  Alk Phosphatase 39 - 117 IU/L 96  Total Bilirubin 0.0 - 1.2 mg/dL 0.4    CBC Latest Ref Rng & Units 03/14/2018 12/05/2017 9/Mann/2018  WBC 3.4 - 10.8 x10E3/uL 5.8 6.8 7.1  Hemoglobin 11.1 - 15.9 g/dL 11.4 11.9(L) 11.1(L)  Hematocrit 34.0 - 46.6 % 33.9(L) 37.7 34.2(L)  Platelets 150 - 450 x10E3/uL 283 230 224   Lab Results  Component Value Date   MCV 87 03/14/2018   MCV 95.4 12/05/2017  MCV 92.2  09/Mann/2018   Lab Results  Component Value Date   TSH 2.580 03/14/2018   No results found for: HGBA1C   BNP    Component Value Date/Time   BNP 246.1 (H) 03/14/2018 1028    ProBNP No results found for: PROBNP   Lipid Panel     Component Value Date/Time   CHOL 107 03/14/2018 1028   TRIG 79 03/14/2018 1028   HDL 40 03/14/2018 1028   CHOLHDL 2.7 03/14/2018 1028   LDLCALC 51 03/14/2018 1028     RADIOLOGY: No results found.   Additional studies/ records that were reviewed today include:   I reviewed the records from the emergency room as well as [facility] medical.  ASSESSMENT:    1. Essential hypertension   2. Medication management   3. SOB (shortness of breath)   4. PVC's (premature ventricular contractions)   5. Grade I diastolic dysfunction   6. Ankle edema     PLAN:  Melinda Mann is a Mann year old widowed female who was the wife of my former patient Mr. Jala Dundon.  Melinda Mann has at least a 30-year history of hypertension and recently developed some atypical chest pain symptomatology.  She admits to shortness of breath.  She denies any exertional symptoms.  I initially saw her, her blood pressure was elevated and she was having frequent ventricular ectopy.  I increased her metoprolol to 50 mg twice a day.  This has improved her sense of palpitations.  Her echo Doppler study shows normal systolic function with grade 1 diastolic dysfunction and abnormal tissue Doppler suggestive of high ventricular filling pressure.  There was mild increased PA pressure 35 mm.  Her resting pulse is still in the 90s.  Her blood pressure today is improved but still elevated.  She also has some trace to 1+ bilateral ankle edema.  I have recommended further titration of metoprolol to 75 mg twice a day.  With her diastolic dysfunction and trace edema I am also adding spironolactone for aldosterone blockade at 12.5 mg daily.  She will have a follow-up be met in 2 weeks.  Her lipid studies are  excellent.  BMI is increased at 31.44 consistent with mild obesity.  Weight loss and increased exercise was recommended.  I have recommended she be seen by her pharmacist in 6 weeks for follow-up evaluation and return to see me in 3 months.     Medication Adjustments/Labs and Tests Ordered: Current medicines are reviewed at length with the patient today.  Concerns regarding medicines are outlined above.  Medication changes, Labs and Tests ordered today are listed in the Patient Instructions below. Patient Instructions  Medication Instructions:  START spironolactone 12.5 mg (1/2 tablet) daily INCREASE metoprolol tartrate (Lopressor) to 75 mg two times daily  If you need a refill on your cardiac medications before your next appointment, please call your pharmacy.   Lab work: RETURN IN 2 WEEKS-you do NOT have to fast  If you have labs (blood work) drawn today and your tests are completely normal, you will receive your results only by: Marland Kitchen MyChart Message (if you have MyChart) OR . A paper copy in the mail If you have any lab test that is abnormal or we need to change your treatment, we will call you to review the results.   Follow-Up: At [facility], you and your health needs are our priority.  As part of our continuing mission to provide you with exceptional heart care, we have created designated  Provider Care Teams.  These Care Teams include your primary Cardiologist (physician) and Advanced Practice Providers (APPs -  Physician Assistants and Nurse Practitioners) who all work together to provide you with the care you need, when you need it. Marland Kitchen Pharmacist in 6 weeks and 3 months with Dr. Claiborne Billings        Signed, Shelva Majestic, MD  04/05/2018 7:38 AM    [facility] [address] [place], [place], [place]  [address] Phone: [phone]

## 2018-04-04 NOTE — Patient Instructions (Addendum)
Medication Instructions:  START spironolactone 12.5 mg (1/2 tablet) daily INCREASE metoprolol tartrate (Lopressor) to 75 mg two times daily  If you need a refill on your cardiac medications before your next appointment, please call your pharmacy.   Lab work: RETURN IN 2 WEEKS-you do NOT have to fast  If you have labs (blood work) drawn today and your tests are completely normal, you will receive your results only by: Marland Kitchen. MyChart Message (if you have MyChart) OR . A paper copy in the mail If you have any lab test that is abnormal or we need to change your treatment, we will call you to review the results.   Follow-Up: At [facility], you and your health needs are our priority.  As part of our continuing mission to provide you with exceptional heart care, we have created designated Provider Care Teams.  These Care Teams include your primary Cardiologist (physician) and Advanced Practice Providers (APPs -  Physician Assistants and Nurse Practitioners) who all work together to provide you with the care you need, when you need it. Marland Kitchen. Pharmacist in 6 weeks and 3 months with Dr. Tresa EndoKelly

## 2018-04-05 ENCOUNTER — Encounter: Payer: Self-pay | Admitting: Cardiovascular Disease

## 2018-05-15 ENCOUNTER — Emergency Department (HOSPITAL_COMMUNITY): Payer: Medicare Other

## 2018-05-15 ENCOUNTER — Observation Stay (HOSPITAL_COMMUNITY)
Admission: EM | Admit: 2018-05-15 | Discharge: 2018-05-17 | Disposition: A | Discharge status: Home / Self Care | Payer: Medicare Other | Attending: Internal Medicine | Admitting: Internal Medicine

## 2018-05-15 ENCOUNTER — Other Ambulatory Visit: Payer: Self-pay

## 2018-05-15 ENCOUNTER — Encounter (HOSPITAL_COMMUNITY): Payer: Self-pay | Admitting: Emergency Medicine

## 2018-05-15 DIAGNOSIS — R931 Abnormal findings on diagnostic imaging of heart and coronary circulation: Secondary | ICD-10-CM | POA: Diagnosis not present

## 2018-05-15 DIAGNOSIS — R001 Bradycardia, unspecified: Secondary | ICD-10-CM | POA: Insufficient documentation

## 2018-05-15 DIAGNOSIS — I110 Hypertensive heart disease with heart failure: Secondary | ICD-10-CM | POA: Diagnosis not present

## 2018-05-15 DIAGNOSIS — E119 Type 2 diabetes mellitus without complications: Secondary | ICD-10-CM

## 2018-05-15 DIAGNOSIS — J9811 Atelectasis: Secondary | ICD-10-CM | POA: Insufficient documentation

## 2018-05-15 DIAGNOSIS — R2689 Other abnormalities of gait and mobility: Secondary | ICD-10-CM | POA: Insufficient documentation

## 2018-05-15 DIAGNOSIS — G319 Degenerative disease of nervous system, unspecified: Secondary | ICD-10-CM | POA: Diagnosis not present

## 2018-05-15 DIAGNOSIS — R55 Syncope and collapse: Secondary | ICD-10-CM | POA: Diagnosis not present

## 2018-05-15 DIAGNOSIS — M5136 Other intervertebral disc degeneration, lumbar region: Secondary | ICD-10-CM | POA: Diagnosis not present

## 2018-05-15 DIAGNOSIS — Z79899 Other long term (current) drug therapy: Secondary | ICD-10-CM | POA: Insufficient documentation

## 2018-05-15 DIAGNOSIS — Z8249 Family history of ischemic heart disease and other diseases of the circulatory system: Secondary | ICD-10-CM | POA: Insufficient documentation

## 2018-05-15 DIAGNOSIS — M5441 Lumbago with sciatica, right side: Secondary | ICD-10-CM

## 2018-05-15 DIAGNOSIS — I5042 Chronic combined systolic (congestive) and diastolic (congestive) heart failure: Secondary | ICD-10-CM | POA: Diagnosis not present

## 2018-05-15 DIAGNOSIS — Z8673 Personal history of transient ischemic attack (TIA), and cerebral infarction without residual deficits: Secondary | ICD-10-CM | POA: Diagnosis not present

## 2018-05-15 DIAGNOSIS — I420 Dilated cardiomyopathy: Secondary | ICD-10-CM | POA: Insufficient documentation

## 2018-05-15 DIAGNOSIS — I2510 Atherosclerotic heart disease of native coronary artery without angina pectoris: Secondary | ICD-10-CM | POA: Diagnosis not present

## 2018-05-15 DIAGNOSIS — I10 Essential (primary) hypertension: Secondary | ICD-10-CM | POA: Diagnosis present

## 2018-05-15 DIAGNOSIS — I083 Combined rheumatic disorders of mitral, aortic and tricuspid valves: Secondary | ICD-10-CM | POA: Insufficient documentation

## 2018-05-15 DIAGNOSIS — M1711 Unilateral primary osteoarthritis, right knee: Secondary | ICD-10-CM | POA: Diagnosis not present

## 2018-05-15 LAB — COMPREHENSIVE METABOLIC PANEL
ALT: 18 U/L (ref 0–44)
AST: 32 U/L (ref 15–41)
Albumin: 3.7 g/dL (ref 3.5–5.0)
Alkaline Phosphatase: 78 U/L (ref 38–126)
Anion gap: 13 (ref 5–15)
BUN: 13 mg/dL (ref 8–23)
CHLORIDE: 102 mmol/L (ref 98–111)
CO2: 19 mmol/L — ABNORMAL LOW (ref 22–32)
Calcium: 9.3 mg/dL (ref 8.9–10.3)
Creatinine, Ser: 1.06 mg/dL — ABNORMAL HIGH (ref 0.44–1.00)
GFR calc Af Amer: 59 mL/min — ABNORMAL LOW (ref >60)
GFR, EST NON AFRICAN AMERICAN: 51 mL/min — AB (ref >60)
Glucose, Bld: 236 mg/dL — ABNORMAL HIGH (ref 70–99)
Potassium: 4.6 mmol/L (ref 3.5–5.1)
Sodium: 134 mmol/L — ABNORMAL LOW (ref 135–145)
Total Bilirubin: 1.1 mg/dL (ref 0.3–1.2)
Total Protein: 6.7 g/dL (ref 6.5–8.1)

## 2018-05-15 LAB — URINALYSIS, ROUTINE W REFLEX MICROSCOPIC
Bilirubin Urine: NEGATIVE
Glucose, UA: NEGATIVE mg/dL
Ketones, ur: NEGATIVE mg/dL
Leukocytes, UA: NEGATIVE
Nitrite: NEGATIVE
Protein, ur: NEGATIVE mg/dL
Specific Gravity, Urine: 1.009 (ref 1.005–1.030)
pH: 6 (ref 5.0–8.0)

## 2018-05-15 LAB — CBC WITH DIFFERENTIAL/PLATELET
ABS IMMATURE GRANULOCYTES: 0.09 10*3/uL — AB (ref 0.00–0.07)
BASOS PCT: 0 %
Basophils Absolute: 0 10*3/uL (ref 0.0–0.1)
Eosinophils Absolute: 0.3 10*3/uL (ref 0.0–0.5)
Eosinophils Relative: 3 %
HCT: 41.2 % (ref 36.0–46.0)
Hemoglobin: 12.7 g/dL (ref 12.0–15.0)
Immature Granulocytes: 1 %
Lymphocytes Relative: 12 %
Lymphs Abs: 1.1 10*3/uL (ref 0.7–4.0)
MCH: 29.5 pg (ref 26.0–34.0)
MCHC: 30.8 g/dL (ref 30.0–36.0)
MCV: 95.6 fL (ref 80.0–100.0)
Monocytes Absolute: 0.3 10*3/uL (ref 0.1–1.0)
Monocytes Relative: 3 %
NEUTROS ABS: 8.1 10*3/uL — AB (ref 1.7–7.7)
NEUTROS PCT: 81 %
Platelets: 258 10*3/uL (ref 150–400)
RBC: 4.31 MIL/uL (ref 3.87–5.11)
RDW: 14.8 % (ref 11.5–15.5)
WBC: 9.9 10*3/uL (ref 4.0–10.5)
nRBC: 0 % (ref 0.0–0.2)

## 2018-05-15 LAB — CBG MONITORING, ED: Glucose-Capillary: 236 mg/dL — ABNORMAL HIGH (ref 70–99)

## 2018-05-15 LAB — I-STAT TROPONIN, ED: TROPONIN I, POC: 0.02 ng/mL (ref 0.00–0.08)

## 2018-05-15 MED ORDER — ESCITALOPRAM OXALATE 10 MG PO TABS
10.0000 mg | ORAL_TABLET | Freq: Every day | ORAL | Status: DC
  Administered 2018-05-16 – 2018-05-17 (×2): 10 mg via ORAL
  Filled 2018-05-15 (×2): qty 1

## 2018-05-15 MED ORDER — LORAZEPAM 1 MG PO TABS
1.0000 mg | ORAL_TABLET | Freq: Two times a day (BID) | ORAL | Status: DC | PRN
  Administered 2018-05-16 – 2018-05-17 (×3): 1 mg via ORAL
  Filled 2018-05-15 (×3): qty 1

## 2018-05-15 MED ORDER — MELOXICAM 7.5 MG PO TABS
15.0000 mg | ORAL_TABLET | Freq: Every day | ORAL | Status: DC
  Administered 2018-05-16 – 2018-05-17 (×2): 15 mg via ORAL
  Filled 2018-05-15 (×3): qty 2

## 2018-05-15 MED ORDER — ACETAMINOPHEN 650 MG RE SUPP: 650.0000 mg | Freq: Four times a day (QID) | RECTAL | Status: DC | PRN

## 2018-05-15 MED ORDER — ENOXAPARIN SODIUM 40 MG/0.4ML ~~LOC~~ SOLN
40.0000 mg | SUBCUTANEOUS | Status: DC
  Administered 2018-05-15 – 2018-05-16 (×2): 40 mg via SUBCUTANEOUS
  Filled 2018-05-15 (×2): qty 0.4

## 2018-05-15 MED ORDER — IBUPROFEN 600 MG PO TABS
800.0000 mg | ORAL_TABLET | Freq: Three times a day (TID) | ORAL | Status: DC
  Administered 2018-05-15 – 2018-05-16 (×2): 800 mg via ORAL
  Filled 2018-05-15 (×3): qty 1

## 2018-05-15 MED ORDER — LACTATED RINGERS IV BOLUS
1000.0000 mL | Freq: Once | INTRAVENOUS | Status: AC
  Administered 2018-05-15: 1000 mL via INTRAVENOUS

## 2018-05-15 MED ORDER — SPIRONOLACTONE 12.5 MG HALF TABLET
12.5000 mg | ORAL_TABLET | Freq: Every day | ORAL | Status: DC
  Administered 2018-05-16 – 2018-05-17 (×2): 12.5 mg via ORAL
  Filled 2018-05-15 (×3): qty 1

## 2018-05-15 MED ORDER — SODIUM CHLORIDE 0.9% FLUSH
3.0000 mL | Freq: Two times a day (BID) | INTRAVENOUS | Status: DC
  Administered 2018-05-15 – 2018-05-17 (×4): 3 mL via INTRAVENOUS

## 2018-05-15 MED ORDER — ADULT MULTIVITAMIN W/MINERALS CH
1.0000 | ORAL_TABLET | Freq: Every day | ORAL | Status: DC
  Administered 2018-05-16 – 2018-05-17 (×2): 1 via ORAL
  Filled 2018-05-15 (×2): qty 1

## 2018-05-15 MED ORDER — FOLIC ACID 1 MG PO TABS
1.0000 mg | ORAL_TABLET | Freq: Every day | ORAL | Status: DC
  Administered 2018-05-16 – 2018-05-17 (×2): 1 mg via ORAL
  Filled 2018-05-15 (×2): qty 1

## 2018-05-15 MED ORDER — IRON PO TABS: 65.0000 mg | ORAL_TABLET | Freq: Every day | ORAL | Status: DC

## 2018-05-15 MED ORDER — ACETAMINOPHEN 325 MG PO TABS
650.0000 mg | ORAL_TABLET | Freq: Four times a day (QID) | ORAL | Status: DC | PRN
  Filled 2018-05-15: qty 2

## 2018-05-15 MED ORDER — FERROUS SULFATE 325 (65 FE) MG PO TABS
325.0000 mg | ORAL_TABLET | Freq: Every day | ORAL | Status: DC
  Administered 2018-05-16 – 2018-05-17 (×2): 325 mg via ORAL
  Filled 2018-05-15 (×4): qty 1

## 2018-05-15 MED ORDER — IRBESARTAN 300 MG PO TABS
300.0000 mg | ORAL_TABLET | Freq: Every day | ORAL | Status: DC
  Administered 2018-05-16 – 2018-05-17 (×2): 300 mg via ORAL
  Filled 2018-05-15 (×2): qty 1

## 2018-05-15 MED ORDER — LEFLUNOMIDE 20 MG PO TABS
10.0000 mg | ORAL_TABLET | Freq: Every day | ORAL | Status: DC
  Administered 2018-05-16 – 2018-05-17 (×2): 10 mg via ORAL
  Filled 2018-05-15 (×4): qty 0.5

## 2018-05-15 NOTE — ED Triage Notes (Signed)
Rectal temp-99.9

## 2018-05-15 NOTE — ED Notes (Signed)
Admitting doc rounding at bedside 

## 2018-05-15 NOTE — ED Notes (Signed)
Patient transported to CT 

## 2018-05-15 NOTE — ED Notes (Signed)
Patient transported to X-ray 

## 2018-05-15 NOTE — ED Notes (Signed)
Pt c/o shortness of breath, Pt resting with eyes closed, SpO2 91% on RA.  Pt aroused easily, SpO2 increased to 97%.  Augustin Coupe RN applied O2 @ 2L via [place].

## 2018-05-15 NOTE — ED Provider Notes (Signed)
[facility] Provider Note   CSN: [id number] Arrival date & time: 05/15/18  1126  History   Chief Complaint Chief Complaint  Patient presents with  . Loss of Consciousness  . Bradycardia    HPI Melinda Mann is a 78 y.o. female.  The history is provided by the patient and the EMS personnel.  Loss of Consciousness  Episode history:  Single Most recent episode:  Today Progression:  Resolved Chronicity:  New Context: normal activity   Witnessed: yes   Relieved by:  Bed rest Associated symptoms: diaphoresis   Associated symptoms: no anxiety, no chest pain, no difficulty breathing, no dizziness, no fever, no focal weakness, no headaches, no nausea, no palpitations, no seizures, no shortness of breath and no vomiting   Risk factors: no coronary artery disease     Past Medical History:  Diagnosis Date  . Diabetes mellitus without complication (HCC)   . Hypertension   . TIA (transient ischemic attack)     Patient Active Problem List   Diagnosis Date Noted  . LVH (left ventricular hypertrophy) 06/15/2013  . Pulmonary nodules/lesions, multiple 06/15/2013  . Hypertension 06/14/2013  . Possible Acute pericarditis, unspecified 06/14/2013  . Chest pain 06/13/2013    History reviewed. No pertinent surgical history.   OB History   No obstetric history on file.      Home Medications    Prior to Admission medications   Medication Sig Start Date End Date Taking? Authorizing Provider  escitalopram (LEXAPRO) 10 MG tablet Take 10 mg by mouth daily.   Yes [provider]  fluticasone (FLONASE) 50 MCG/ACT nasal spray 1 to 2 sprays each nostril as needed 07/10/13  Yes [provider]  folic acid (FOLVITE) 1 MG tablet Take 1 mg by mouth daily. 02/01/18  Yes [provider]  GARLIC PO Take 2 capsules by mouth daily.   Yes [provider]  hydrochlorothiazide (HYDRODIURIL) 12.5 MG tablet Take 12.5 mg by mouth  daily. 02/09/18  Yes [provider]  ibuprofen (ADVIL,MOTRIN) 800 MG tablet Take 1 tablet (800 mg total) by mouth 3 (three) times daily. With food 06/15/13  Yes Drema DallasWoods, Curtis J, MD  Iron TABS Take 65 mg by mouth daily.   Yes [provider]  leflunomide (ARAVA) 10 MG tablet Take 10 mg by mouth daily.   Yes [provider]  LORazepam (ATIVAN) 1 MG tablet Take 1 mg by mouth 2 (two) times daily as needed for anxiety.    Yes [provider]  meloxicam (MOBIC) 15 MG tablet Take 15 mg by mouth daily.   Yes [provider]  metoprolol tartrate 75 MG TABS Take 75 mg by mouth 2 (two) times daily. 04/04/18  Yes Lennette BihariKelly, Thomas A, MD  Multiple Vitamin (MULTIVITAMIN WITH MINERALS) TABS tablet Take 1 tablet by mouth daily.   Yes [provider]  spironolactone (ALDACTONE) 25 MG tablet Take 0.5 tablets (12.5 mg total) by mouth daily. 04/04/18 07/03/18 Yes Lennette BihariKelly, Thomas A, MD  valsartan (DIOVAN) 320 MG tablet Take 320 mg by mouth daily. 02/01/18  Yes [provider]    Family History Family History  Problem Relation Age of Onset  . Colon cancer Brother   . Colon cancer Brother   . Lung cancer Sister        never smoked  . Heart disease Mother   . Breast cancer Mother   . Heart attack Sister   . Heart disease Brother   .  Breast cancer Sister     Social History Social History   Tobacco Use  . Smoking status: Never Smoker  . Smokeless tobacco: Never Used  Substance Use Topics  . Alcohol use: No  . Drug use: No     Allergies   Patient has no known allergies.   Review of Systems Review of Systems  Constitutional: Positive for diaphoresis and fatigue. Negative for chills and fever.  HENT: Negative for ear pain and sore throat.   Eyes: Negative for pain and visual disturbance.  Respiratory: Negative for cough and shortness of breath.   Cardiovascular: Positive for syncope. Negative for chest pain and palpitations.  Gastrointestinal:  Negative for abdominal distention, abdominal pain, anal bleeding, blood in stool, nausea and vomiting.  Genitourinary: Negative for decreased urine volume, difficulty urinating, dysuria, hematuria, urgency, vaginal bleeding, vaginal discharge and vaginal pain.  Musculoskeletal: Negative for arthralgias and back pain.  Skin: Negative for color change and rash.  Neurological: Positive for syncope. Negative for dizziness, focal weakness, seizures and headaches.  All other systems reviewed and are negative.    Physical Exam Updated Vital Signs  ED Triage Vitals  Enc Vitals Group     BP 05/15/18 1134 (!) 159/82     Pulse Rate 05/15/18 1134 76     Resp 05/15/18 1134 19     Temp --      Temp src --      SpO2 05/15/18 1132 98 %     Weight 05/15/18 1142 294 lb (133.4 kg)     Height --      Head Circumference --      Peak Flow --      Pain Score 05/15/18 1142 0     Pain Loc --      Pain Edu? --      Excl. in GC? --     Physical Exam Vitals signs and nursing note reviewed.  Constitutional:      Appearance: She is well-developed. She is ill-appearing and diaphoretic.  HENT:     Head: Normocephalic and atraumatic.     Nose: Nose normal.     Mouth/Throat:     Mouth: Mucous membranes are moist.     Pharynx: No oropharyngeal exudate.  Eyes:     Extraocular Movements: Extraocular movements intact.     Conjunctiva/sclera: Conjunctivae normal.     Pupils: Pupils are equal, round, and reactive to light.  Neck:     Musculoskeletal: Neck supple.  Cardiovascular:     Rate and Rhythm: Normal rate and regular rhythm.     Pulses: Normal pulses.     Heart sounds: Normal heart sounds. No murmur.  Pulmonary:     Effort: Pulmonary effort is normal. No respiratory distress.     Breath sounds: Normal breath sounds.  Abdominal:     General: Abdomen is flat. There is no distension.     Palpations: Abdomen is soft.     Tenderness: There is no abdominal tenderness.  Skin:    General: Skin is  warm.     Coloration: Skin is pale.  Neurological:     General: No focal deficit present.     Mental Status: She is alert and oriented to person, place, and time.     Cranial Nerves: No cranial nerve deficit.     Sensory: No sensory deficit.     Motor: No weakness.     Coordination: Coordination normal.     Comments: 5+/5 strength, normal sensation, no drift  Psychiatric:        Mood and Affect: Mood normal.      ED Treatments / Results  Labs (all labs ordered are listed, but only abnormal results are displayed) Labs Reviewed  CBC WITH DIFFERENTIAL/PLATELET - Abnormal; Notable for the following components:      Result Value   Neutro Abs 8.1 (*)    Abs Immature Granulocytes 0.09 (*)    All other components within normal limits  COMPREHENSIVE METABOLIC PANEL - Abnormal; Notable for the following components:   Sodium 134 (*)    CO2 19 (*)    Glucose, Bld 236 (*)    Creatinine, Ser 1.06 (*)    GFR calc non Af Amer 51 (*)    GFR calc Af Amer 59 (*)    All other components within normal limits  CBG MONITORING, ED - Abnormal; Notable for the following components:   Glucose-Capillary 236 (*)    All other components within normal limits  URINALYSIS, ROUTINE W REFLEX MICROSCOPIC  I-STAT TROPONIN, ED    EKG EKG Interpretation  Date/Time:  Sunday May 15 2018 11:29:19 EST Ventricular Rate:  63 PR Interval:    QRS Duration: 106 QT Interval:  489 QTC Calculation: 455 R Axis:   99 Text Interpretation:  Sinus or ectopic atrial rhythm Confirmed by Virgina Norfolk [phone]) on 05/15/2018 11:40:57 AM   Radiology Dg Chest 2 View  Result Date: 05/15/2018 CLINICAL DATA:  Syncope EXAM: CHEST - 2 VIEW COMPARISON:  12/05/2017 FINDINGS: 1259 hours. The cardio pericardial silhouette is enlarged. Pulmonary vascular congestion with diffuse alveolar opacity suggests edema. There is more focal right base atelectasis or infiltrate. The visualized bony structures of the thorax are intact.  Telemetry leads overlie the chest. IMPRESSION: 1. Cardiomegaly with diffuse pulmonary edema pattern. 2. Right base atelectasis or infiltrate. Electronically Signed   By: Kennith Center M.D.   On: 05/15/2018 13:48   Ct Head Wo Contrast  Result Date: 05/15/2018 CLINICAL DATA:  Head injury after fall. EXAM: CT HEAD WITHOUT CONTRAST TECHNIQUE: Contiguous axial images were obtained from the base of the skull through the vertex without intravenous contrast. COMPARISON:  CT scan of May 07, 2013. FINDINGS: Brain: Mild diffuse cortical atrophy. 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 No mass effect or midline shift is noted. Ventricular size is within normal limits. There is no evidence of mass lesion, hemorrhage or acute infarction. Vascular: No hyperdense vessel or unexpected calcification. Skull: Normal. Negative for fracture or focal lesion. Sinuses/Orbits: Left maxillary mucous retention cyst is noted. Other: None. IMPRESSION: Mild diffuse cortical atrophy. No acute intracranial abnormality seen. Electronically Signed   By: Lupita Raider, M.D.   On: 05/15/2018 12:45    Procedures Procedures (including critical care time)  Medications Ordered in ED Medications  lactated ringers bolus 1,000 mL (0 mLs Intravenous Stopped 05/15/18 1322)     Initial Impression / Assessment and Plan / ED Course  I have reviewed the triage vital signs and the nursing notes.  Pertinent labs & imaging results that were available during my care of the patient were reviewed by me and considered in my medical decision making (see chart for details).     Melinda Mann is a 78 year old female history of hypertension, TIA, diabetes who presents to the ED after syncopal event.  Patient with overall unremarkable vitals.  No fever.  Patient with syncopal event after going to the grocery store with her daughter.  Patient states that she was  standing by the car and all of a sudden found herself on the floor.  No true prodome. EMS states they found the patient diaphoretic, pale.  She denies any chest pain, shortness of breath.  Overall is asymptomatic now but is still sweaty and diaphoretic on exam.  EKG with EMS showed bradycardia in the 40s.  Appeared to be likely a sinus rhythm.  No obvious heart block.  EKG here shows sinus rhythm.  Patient recently had her beta-blocker dose increased to 75 mg twice a day.  Patient appears pale but denies any melena, hematochezia.  Has not needed blood transfusions in the past.  Has clear breath sounds, no signs of volume overload.  Will get labs including CT head.  Suspect possible anemia versus electrolyte abnormality versus arrhythmia versus dehydration, however differential is broad.  Patient with troponin within normal limits.  CT head showed no acute findings.  Chest x-ray showed no signs of pneumonia, pneumothorax, pleural effusion.  No significant electrolyte abnormality, kidney injury, leukocytosis.  Patient did have some mild elevation of her glucose.  Patient felt improved following additional IV fluids.  Given syncopal event and concern for possible arrhythmia will admit for further telemetry and further syncope work-up.  Suspect also possibly due to beta-blockade/medications.  Remained hemodynamically stable throughout my care and admitted to medicine in stable condition.  Low concern for infectious process at this time.  However patient team will follow-up.  This chart was dictated using voice recognition software.  Despite best efforts to proofread,  errors can occur which can change the documentation meaning.   Final Clinical Impressions(s) / ED Diagnoses   Final diagnoses:  Syncope and collapse    ED Discharge Orders    None       Virgina NorfolkCuratolo, Josey Forcier, DO 05/15/18 1435

## 2018-05-15 NOTE — Progress Notes (Signed)
Patient states that she has had several falls at home while doing chores.Gives example of raking leaves and weed eating and falls, stating she feels like she is loosing her balance.  Advised patient to ring call bell for assistance, not to get up independently. Patient is alert and oriented x 4.   Patient states she was in auto today waiting on daughter to grocery shop. Patient felt it was taking daughter too long so she proceeded to get out of car and as she stood up, she states her foot slipped off the curb and she fell. Patient states she does not recall loosing consciousness.  Bed alarm on, bed low position.

## 2018-05-15 NOTE — ED Triage Notes (Signed)
Pt in via [facility] after syncopal fall out of car at [employer]. When EMS arrived, pt was diaphoretic, pale and brady in 40's. CBG 269, has had cough x 1 wk, has wheezing and rhonchi present. Given 5mg  Albuterol en route. Seeing heart doc for bradycardia currently. Arrives a&ox4, BP 132/68, given 200 ml's NS

## 2018-05-15 NOTE — Progress Notes (Signed)
Alert and oriented female admitted to unit from ED. Patient oriented to call bell and room and menu. No c/o offered at this time.  Bed low position, bed alarm on.

## 2018-05-15 NOTE — H&P (Signed)
History and Physical    Melinda LavMargie M Luton XBM:[id number] DOB: [birth year] DOA: 05/15/2018  PCP: Irena Reichmannollins, Dana, DO  Patient coming from: Home.  I have personally briefly reviewed patient's old medical records in Epic and Care everywhere.   Chief Complaint: Larey SeatFell at the parking lot.   HPI: Melinda Mann is a 78 y.o. female with medical history significant of type 2 diabetes, hypertension, history of TIA and diastolic dysfunction who presents to the hospital with multiple episodes of fall at home, losing balance and worsening shortness of breath.  Patient is accompanied by her daughter who is mostly giving history.  According to the patient, following 1 time a day is normal for her.  She says she usually falls when she tries to ambulate.  Most of the time this is because of losing balance.  She denies any chest pain, palpitations, dizziness or lightheadedness before the fall.  According to patient, she was recently seen by cardiologist and had increased dose of blood pressure medication probably beta-blockers.  She lives with her daughter.  They went to grocery store today, she stayed in the car while her daughter was doing grocery, she wanted to get out of the car as the daughter was taking too long time.  She stepped out of the car, her foot slipped off the curb and she fell hitting her head.  She did not lose any consciousness.  She denies any preceding symptoms including dizziness or lightheadedness.  She does complain of shortness of breath which is mostly chronic for her.  Her daughter is mostly speaking for mother and she says that her mother is hiding her symptoms to not to come to the hospital or doctors. At the time of my evaluation, patient denied any problems other than falling frequently. ED Course: Patient is hemodynamically stable.  Her blood pressures are stable.  EMS reported bradycardia with heart rate of 40 on presentation.  Patient recently had increased beta-blockers to 75 mg twice a day.   She had no evidence of skeletal injury.  Troponins are normal limits.  EKG was normal.  Chest x-ray is normal.  Electrolytes are normal.  With persistent symptoms, needing telemetry monitoring and investigations in the hospital.  Review of Systems: As per HPI otherwise 10 point review of systems negative.    Past Medical History:  Diagnosis Date  . Diabetes mellitus without complication (HCC)   . Hypertension   . TIA (transient ischemic attack)     History reviewed. No pertinent surgical history.   reports that she has never smoked. She has never used smokeless tobacco. She reports that she does not drink alcohol or use drugs.  No Known Allergies  Family History  Problem Relation Age of Onset  . Colon cancer Brother   . Colon cancer Brother   . Lung cancer Sister        never smoked  . Heart disease Mother   . Breast cancer Mother   . Heart attack Sister   . Heart disease Brother   . Breast cancer Sister      Prior to Admission medications   Medication Sig Start Date End Date Taking? Authorizing Provider  escitalopram (LEXAPRO) 10 MG tablet Take 10 mg by mouth daily.   Yes [provider]  fluticasone (FLONASE) 50 MCG/ACT nasal spray 1 to 2 sprays each nostril as needed 07/10/13  Yes [provider]  folic acid (FOLVITE) 1 MG tablet Take 1 mg by mouth daily. 02/01/18  Yes [provider]  GARLIC PO Take 2 capsules by mouth daily.   Yes [provider]  hydrochlorothiazide (HYDRODIURIL) 12.5 MG tablet Take 12.5 mg by mouth daily. 02/09/18  Yes [provider]  ibuprofen (ADVIL,MOTRIN) 800 MG tablet Take 1 tablet (800 mg total) by mouth 3 (three) times daily. With food 06/15/13  Yes Drema Dallas, MD  Iron TABS Take 65 mg by mouth daily.   Yes [provider]  leflunomide (ARAVA) 10 MG tablet Take 10 mg by mouth daily.   Yes [provider]  LORazepam (ATIVAN) 1 MG tablet Take 1 mg by mouth 2 (two) times daily as  needed for anxiety.    Yes [provider]  meloxicam (MOBIC) 15 MG tablet Take 15 mg by mouth daily.   Yes [provider]  metoprolol tartrate 75 MG TABS Take 75 mg by mouth 2 (two) times daily. 04/04/18  Yes Lennette Bihari, MD  Multiple Vitamin (MULTIVITAMIN WITH MINERALS) TABS tablet Take 1 tablet by mouth daily.   Yes [provider]  spironolactone (ALDACTONE) 25 MG tablet Take 0.5 tablets (12.5 mg total) by mouth daily. 04/04/18 07/03/18 Yes Lennette Bihari, MD  valsartan (DIOVAN) 320 MG tablet Take 320 mg by mouth daily. 02/01/18  Yes [provider]    Physical Exam: Vitals:   05/15/18 1358 05/15/18 1400 05/15/18 1500 05/15/18 1550  BP:  (!) 164/78 109/70 (!) 161/117  Pulse:  66 68 (!) 59  Resp:  19 15   Temp: 99.9 F (37.7 C)   97.9 F (36.6 C)  TempSrc: Rectal   Oral  SpO2:  98% 100% 99%  Weight:    86.3 kg  Height:    5\' 6"  (1.676 m)    Constitutional: NAD, calm, comfortable Vitals:   05/15/18 1358 05/15/18 1400 05/15/18 1500 05/15/18 1550  BP:  (!) 164/78 109/70 (!) 161/117  Pulse:  66 68 (!) 59  Resp:  19 15   Temp: 99.9 F (37.7 C)   97.9 F (36.6 C)  TempSrc: Rectal   Oral  SpO2:  98% 100% 99%  Weight:    86.3 kg  Height:    5\' 6"  (1.676 m)   Eyes: PERRL, lids and conjunctivae normal.  Patient looks fairly comfortable.  She is alert oriented x3.  She denies any symptoms at rest.  She has not walked since being in the hospital. ENMT: Mucous membranes are moist. Posterior pharynx clear of any exudate or lesions.Normal dentition.  Neck: normal, supple, no masses, no thyromegaly Respiratory: clear to auscultation bilaterally, no wheezing, no crackles. Normal respiratory effort. No accessory muscle use.  Cardiovascular: Regular rate and rhythm, no murmurs / rubs / gallops. No extremity edema. 2+ pedal pulses. No carotid bruits.  Abdomen: no tenderness, no masses palpated. No hepatosplenomegaly. Bowel sounds positive.    Musculoskeletal: no clubbing / cyanosis. No joint deformity upper and lower extremities. Good ROM, no contractures. Normal muscle tone.  Skin: no rashes, lesions, ulcers. No induration Neurologic: CN 2-12 grossly intact. Sensation intact, DTR normal. Strength 5/5 in all 4.  Psychiatric: Normal judgment and insight. Alert and oriented x 3. Normal mood.     Labs on Admission: I have personally reviewed following labs and imaging studies  CBC: Recent Labs  Lab 05/15/18 1144  WBC 9.9  NEUTROABS 8.1*  HGB 12.7  HCT 41.2  MCV 95.6  PLT 258   Basic Metabolic Panel: Recent Labs  Lab 05/15/18 1144  NA 134*  K 4.6  CL 102  CO2 19*  GLUCOSE 236*  BUN 13  CREATININE 1.06*  CALCIUM 9.3   GFR: Estimated Creatinine Clearance: 50.7 mL/min (A) (by C-G formula based on SCr of 1.06 mg/dL (H)). Liver Function Tests: Recent Labs  Lab 05/15/18 1144  AST 32  ALT 18  ALKPHOS 78  BILITOT 1.1  PROT 6.7  ALBUMIN 3.7   No results for input(s): LIPASE, AMYLASE in the last 168 hours. No results for input(s): AMMONIA in the last 168 hours. Coagulation Profile: No results for input(s): INR, PROTIME in the last 168 hours. Cardiac Enzymes: No results for input(s): CKTOTAL, CKMB, CKMBINDEX, TROPONINI in the last 168 hours. BNP (last 3 results) No results for input(s): PROBNP in the last 8760 hours. HbA1C: No results for input(s): HGBA1C in the last 72 hours. CBG: Recent Labs  Lab 05/15/18 1132  GLUCAP 236*   Lipid Profile: No results for input(s): CHOL, HDL, LDLCALC, TRIG, CHOLHDL, LDLDIRECT in the last 72 hours. Thyroid Function Tests: No results for input(s): TSH, T4TOTAL, FREET4, T3FREE, THYROIDAB in the last 72 hours. Anemia Panel: No results for input(s): VITAMINB12, FOLATE, FERRITIN, TIBC, IRON, RETICCTPCT in the last 72 hours. Urine analysis:    Component Value Date/Time   COLORURINE YELLOW 05/15/2018 1458   APPEARANCEUR CLEAR 05/15/2018 1458   LABSPEC 1.009 05/15/2018  1458   PHURINE 6.0 05/15/2018 1458   GLUCOSEU NEGATIVE 05/15/2018 1458   HGBUR SMALL (A) 05/15/2018 1458   BILIRUBINUR NEGATIVE 05/15/2018 1458   KETONESUR NEGATIVE 05/15/2018 1458   PROTEINUR NEGATIVE 05/15/2018 1458   UROBILINOGEN 1.0 06/13/2013 2106   NITRITE NEGATIVE 05/15/2018 1458   LEUKOCYTESUR NEGATIVE 05/15/2018 1458    Radiological Exams on Admission: Dg Chest 2 View  Result Date: 05/15/2018 CLINICAL DATA:  Syncope EXAM: CHEST - 2 VIEW COMPARISON:  12/05/2017 FINDINGS: 1259 hours. The cardio pericardial silhouette is enlarged. Pulmonary vascular congestion with diffuse alveolar opacity suggests edema. There is more focal right base atelectasis or infiltrate. The visualized bony structures of the thorax are intact. Telemetry leads overlie the chest. IMPRESSION: 1. Cardiomegaly with diffuse pulmonary edema pattern. 2. Right base atelectasis or infiltrate. Electronically Signed   By: Kennith Center M.D.   On: 05/15/2018 13:48   Ct Head Wo Contrast  Result Date: 05/15/2018 CLINICAL DATA:  Head injury after fall. EXAM: CT HEAD WITHOUT CONTRAST TECHNIQUE: Contiguous axial images were obtained from the base of the skull through the vertex without intravenous contrast. COMPARISON:  CT scan of May 07, 2013. FINDINGS: Brain: Mild diffuse cortical atrophy. 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 No mass effect or midline shift is noted. Ventricular size is within normal limits. There is no evidence of mass lesion, hemorrhage or acute infarction. Vascular: No hyperdense vessel or unexpected calcification. Skull: Normal. Negative for fracture or focal lesion. Sinuses/Orbits: Left maxillary mucous retention cyst is noted. Other: None. IMPRESSION: Mild diffuse cortical atrophy. No acute intracranial abnormality seen. Electronically Signed   By: Lupita Raider, M.D.   On: 05/15/2018 12:45    EKG: Independently reviewed.  Normal sinus rhythm.  Normal ST-T wave.  QTC is  455.  Assessment/Plan Principal Problem:   Syncope and collapse Active Problems:   Hypertension   History of TIA (transient ischemic attack)   Syncope and collapse, recurrent fall: Suspect vasovagal.  Also suspect bradycardia. Agree with monitoring in telemetry unit.  Check orthostatic blood pressures.  Will hold beta-blockers now.  Will check 2D echocardiogram, previous echocardiogram with diastolic dysfunction.  If any abnormalities on cardiac rhythm or echocardiogram, will consult her cardiologist. Work with PT OT, it could primarily be a balance problem. A CT head was normal.  Hypertension: Fairly stable.  Her blood pressures are elevated in the ER.  Will check orthostatic.  Resume her home medications except beta-blockers to monitor her heart rate.  History of TIA: No focal deficit.  CT head negative.  DVT prophylaxis: Lovenox. Code Status: Full code. Family Communication: Daughter at the bedside.  She lives with her. Disposition Plan: Home with home care.  Home therapies. Consults called: None. Admission status: Observation telemetry.   Dorcas Carrow MD [facility] Pager [phone]  If 7PM-7AM, please contact night-coverage [email or web address] Password [facility]  05/15/2018, 5:01 PM

## 2018-05-16 ENCOUNTER — Observation Stay (HOSPITAL_BASED_OUTPATIENT_CLINIC_OR_DEPARTMENT_OTHER): Payer: Medicare Other

## 2018-05-16 ENCOUNTER — Other Ambulatory Visit: Payer: Self-pay

## 2018-05-16 ENCOUNTER — Observation Stay (HOSPITAL_COMMUNITY): Payer: Medicare Other

## 2018-05-16 DIAGNOSIS — R001 Bradycardia, unspecified: Secondary | ICD-10-CM

## 2018-05-16 DIAGNOSIS — I2510 Atherosclerotic heart disease of native coronary artery without angina pectoris: Secondary | ICD-10-CM

## 2018-05-16 DIAGNOSIS — R0609 Other forms of dyspnea: Secondary | ICD-10-CM | POA: Diagnosis not present

## 2018-05-16 DIAGNOSIS — I5189 Other ill-defined heart diseases: Secondary | ICD-10-CM | POA: Diagnosis not present

## 2018-05-16 DIAGNOSIS — I340 Nonrheumatic mitral (valve) insufficiency: Secondary | ICD-10-CM

## 2018-05-16 DIAGNOSIS — R55 Syncope and collapse: Secondary | ICD-10-CM

## 2018-05-16 DIAGNOSIS — M5441 Lumbago with sciatica, right side: Secondary | ICD-10-CM

## 2018-05-16 DIAGNOSIS — I351 Nonrheumatic aortic (valve) insufficiency: Secondary | ICD-10-CM | POA: Diagnosis not present

## 2018-05-16 LAB — ECHOCARDIOGRAM COMPLETE
Height: 66 in
Weight: 3006.4 oz

## 2018-05-16 MED ORDER — SODIUM CHLORIDE 0.9 % WEIGHT BASED INFUSION
3.0000 mL/kg/h | INTRAVENOUS | Status: DC
  Administered 2018-05-17: 3 mL/kg/h via INTRAVENOUS

## 2018-05-16 MED ORDER — SODIUM CHLORIDE 0.9 % IV SOLN: 250.0000 mL | INTRAVENOUS | Status: DC | PRN

## 2018-05-16 MED ORDER — HYDROCHLOROTHIAZIDE 25 MG PO TABS
25.0000 mg | ORAL_TABLET | Freq: Every day | ORAL | Status: DC
  Administered 2018-05-16 – 2018-05-17 (×2): 25 mg via ORAL
  Filled 2018-05-16 (×2): qty 1

## 2018-05-16 MED ORDER — SODIUM CHLORIDE 0.9 % WEIGHT BASED INFUSION
1.0000 mL/kg/h | INTRAVENOUS | Status: DC
  Administered 2018-05-17: 1 mL/kg/h via INTRAVENOUS

## 2018-05-16 MED ORDER — METOPROLOL TARTRATE 50 MG PO TABS
50.0000 mg | ORAL_TABLET | Freq: Two times a day (BID) | ORAL | Status: DC
  Administered 2018-05-16 – 2018-05-17 (×3): 50 mg via ORAL
  Filled 2018-05-16 (×3): qty 1

## 2018-05-16 MED ORDER — SODIUM CHLORIDE 0.9% FLUSH: 3.0000 mL | INTRAVENOUS | Status: DC | PRN

## 2018-05-16 MED ORDER — ASPIRIN 81 MG PO CHEW
81.0000 mg | CHEWABLE_TABLET | ORAL | Status: AC
  Administered 2018-05-17: 81 mg via ORAL
  Filled 2018-05-16: qty 1

## 2018-05-16 MED ORDER — HYDROCHLOROTHIAZIDE 12.5 MG PO CAPS: 12.5000 mg | ORAL_CAPSULE | Freq: Every day | ORAL | Status: DC

## 2018-05-16 MED ORDER — SODIUM CHLORIDE 0.9% FLUSH
3.0000 mL | Freq: Two times a day (BID) | INTRAVENOUS | Status: DC
  Administered 2018-05-17: 3 mL via INTRAVENOUS

## 2018-05-16 NOTE — Progress Notes (Signed)
Pt sbp 180's after am avapro given, noted lopressor dc when pt came to ED brady 40's, paged Dr Gonzella Lex to advise of bp

## 2018-05-16 NOTE — Evaluation (Signed)
Occupational Therapy Evaluation Patient Details Name: Melinda Mann MRN: [id number] DOB: [birth year] Today's Date: 05/16/2018    History of Present Illness Melinda Mann is a 78 y.o. female with medical history significant of type 2 diabetes, hypertension, history of TIA and diastolic dysfunction who presents to the hospital with multiple episodes of fall at home, losing balance and worsening shortness of breath.  Patient is accompanied by her daughter who is mostly giving history.  According to the patient, has been falling 1 time a day and  is normal for her.  She says she usually falls when she tries to ambulate.  Most of the time this is because of losing balance.   Clinical Impression   Pt is independent with functional mobility and sup - min guard A with LB selfcare. No further acute OT is indicated at this time. Pt able to transfer to toilet and shower without physical assist, stand at sink for grooming and ADLs without physical assist or LOB. All education completed and no further acute OT is indicated at this time    Follow Up Recommendations  No OT follow up    Equipment Recommendations  None recommended by OT    Recommendations for Other Services       Precautions / Restrictions Precautions Precautions: Fall Restrictions Weight Bearing Restrictions: No      Mobility Bed Mobility Overal bed mobility: Independent                Transfers Overall transfer level: Independent                    Balance Overall balance assessment: Needs assistance Sitting-balance support: No upper extremity supported;Feet supported Sitting balance-Leahy Scale: Fair     Standing balance support: Single extremity supported;During functional activity Standing balance-Leahy Scale: Fair Standing balance comment: Pt can stand without UE support for up to a minute.  Pt washed hands at sink without LOB.                           ADL either performed or assessed with  clinical judgement   ADL Overall ADL's : Needs assistance/impaired     Grooming: Wash/dry hands;Wash/dry face;Standing;Supervision/safety;Set up   Upper Body Bathing: Min guard;Standing   Lower Body Bathing: Min guard;Sit to/from stand   Upper Body Dressing : Min guard;Standing   Lower Body Dressing: Min guard;Sit to/from stand   Toilet Transfer: Independent;Ambulation;Grab bars   Toileting- Clothing Manipulation and Hygiene: Supervision/safety;Sit to/from stand   Tub/ Engineer, structural: Independent;Ambulation;Grab bars   Functional mobility during ADLs: Independent General ADL Comments: pt reaching out for furniture and required cues to use RW consistently     Vision Baseline Vision/History: Wears glasses Wears Glasses: At all times Patient Visual Report: No change from baseline       Perception     Praxis      Pertinent Vitals/Pain Pain Assessment: 0-10 Pain Score: 6  Faces Pain Scale: Hurts little more Pain Location: Right hip,thigh to knee  Pain Descriptors / Indicators: Aching;Grimacing;Guarding Pain Intervention(s): Monitored during session;Repositioned     Hand Dominance Right   Extremity/Trunk Assessment Upper Extremity Assessment Upper Extremity Assessment: Overall WFL for tasks assessed   Lower Extremity Assessment Lower Extremity Assessment: Defer to PT evaluation   Cervical / Trunk Assessment Cervical / Trunk Assessment: Normal   Communication Communication Communication: No difficulties   Cognition Arousal/Alertness: Awake/alert Behavior During Therapy: WFL for tasks assessed/performed Overall Cognitive  Status: Within Functional Limits for tasks assessed                                     General Comments       Exercises Exercises: General Lower Extremity General Exercises - Lower Extremity Ankle Circles/Pumps: AROM;Both;10 reps;Supine Long Arc Quad: AROM;Both;10 reps;Seated   Shoulder Instructions      Home Living  Family/patient expects to be discharged to:: Private residence Living Arrangements: Children Available Help at Discharge: Family;Available 24 hours/day Type of Home: House Home Access: Stairs to enter [employer] of Steps: 2 Entrance Stairs-Rails: Right;Left;Can reach both Home Layout: One level     Bathroom Shower/Tub: Chief Strategy Officer: Standard     Home Equipment: Environmental consultant - 2 wheels;Walker - 4 wheels;Cane - single point;Wheelchair - manual;Hand held shower head;Grab bars - tub/shower;Toilet riser;Shower seat          Prior Functioning/Environment Level of Independence: Independent with assistive device(s);Needs assistance  Gait / Transfers Assistance Needed: pt states she use RW at times, other times did not but has been falling ADL's / Homemaking Assistance Needed: independent            OT Problem List: Decreased activity tolerance;Pain      OT Treatment/Interventions: Self-care/ADL training    OT Goals(Current goals can be found in the care plan section) Acute Rehab OT Goals Patient Stated Goal: to go home OT Goal Formulation: With patient  OT Frequency:     Barriers to D/C:    no barriers       Co-evaluation              AM-PAC OT "6 Clicks" Daily Activity     Outcome Measure Help from another person eating meals?: None Help from another person taking care of personal grooming?: None Help from another person toileting, which includes using toliet, bedpan, or urinal?: None Help from another person bathing (including washing, rinsing, drying)?: None Help from another person to put on and taking off regular upper body clothing?: None Help from another person to put on and taking off regular lower body clothing?: None 6 Click Score: 24   End of Session Equipment Utilized During Treatment: Gait belt;Rolling walker  Activity Tolerance: Patient tolerated treatment well Patient left: in bed;with call bell/phone within  reach  OT Visit Diagnosis: Unsteadiness on feet (R26.81);Pain Pain - Right/Left: Right Pain - part of body: Hip;Leg                Time: 1201-1228 OT Time Calculation (min): 27 min Charges:  OT General Charges $OT Visit: 1 Visit OT Evaluation $OT Eval Moderate Complexity: 1 Mod OT Treatments $Self Care/Home Management : 8-22 mins    Galen Manila 05/16/2018, 1:54 PM

## 2018-05-16 NOTE — Consult Note (Addendum)
Cardiology Consultation:   Patient ID: Melinda Mann MRN: [id number]; DOB: 26-May-1940  Admit date: 05/15/2018 Date of Consult: 05/16/2018  Primary Care Provider: Irena Reichmann, DO Primary Cardiologist: Melinda Guadalajara, MD  Primary Electrophysiologist:  None   Patient Profile:   Melinda Mann is a 78 y.o. female with a hx of diabetes type 2, hypertension, History of TIA and diastolic dysfunctionwho is being seen today for the evaluation of syncope and decreased EF at the request of Melinda Mann.  History of Present Illness:   Melinda Mann has a history of hypertension for at least 30 years.  She was evaluated for chest pain in August 2019 and noted to have sinus tachycardia with frequent PVCs.  Her chest pain was felt to be atypical.  Echocardiogram in 03/2018 showed EF 60-65% with grade 1 diastolic dysfunction and moderate aortic insufficiency.  There was mild increase in PA pressure 35 mmHg.  She was last seen in the office on 04/04/2018 at which time she was in sinus rhythm with first-degree AV block with a rate of 91 bpm and occasional PVC.  Was noted that her resting pulse was still in the 90s and her blood pressure was improved but still elevated.  Her metoprolol was further titrated up to 75 mg twice daily.  Melinda Mann tells me that she started falling about 3 weeks ago.  She denies any lightheadedness or dizziness prior to her falls, she says she just goes down.  Possibly due to a loss of balance.  Yesterday she rode to the grocery store with her daughter planning to stay in the car as she was wearing her robe and slippers.  Her daughter was taking a long time so she decided to get out of the car and go in and see what she was doing.  Shortly after standing up and getting out of the car she says she just went down.  She had no prior lightheadedness or dizziness.  She says that she did not lose consciousness.  She was short of breath after falling.  Per review of emergency department notes EMS found the  patient to be diaphoretic and pale.  She denied chest pain or shortness of breath.  EKG with EMS showed bradycardia in the 40s.  The patient had no significant electrolyte abnormality, kidney injury or leukocytosis.  She was given IV fluids with improvement in her symptoms.  The patient says that she has had right leg pain from the sacrum to the knee for several months but she does not think this contributed to her fall.  She has trace ankle edema that has been present for several months.  She has been having shortness of breath with exertion also for many months.  No chest pain pressure or tightness.  Today she is short of breath just going to the bathroom.  The patient states that she has been taking her medications as directed.  She denies having accidentally taken any extra beta-blocker.  She has not had any recent illness.  She does have heartburn that is well controlled when she takes Prilosec.    Past Medical History:  Diagnosis Date  . Diabetes mellitus without complication (HCC)   . Hypertension   . TIA (transient ischemic attack)     History reviewed. No pertinent surgical history.   Home Medications:  Prior to Admission medications   Medication Sig Start Date End Date Taking? Authorizing Provider  escitalopram (LEXAPRO) 10 MG tablet Take 10 mg by mouth daily.  Yes [provider]  fluticasone (FLONASE) 50 MCG/ACT nasal spray 1 to 2 sprays each nostril as needed 07/10/13  Yes [provider]  folic acid (FOLVITE) 1 MG tablet Take 1 mg by mouth daily. 02/01/18  Yes [provider]  GARLIC PO Take 2 capsules by mouth daily.   Yes [provider]  hydrochlorothiazide (HYDRODIURIL) 12.5 MG tablet Take 12.5 mg by mouth daily. 02/09/18  Yes [provider]  Iron TABS Take 65 mg by mouth daily.   Yes [provider]  leflunomide (ARAVA) 10 MG tablet Take 10 mg by mouth daily.   Yes [provider]  LORazepam (ATIVAN) 1 MG tablet  Take 1 mg by mouth 2 (two) times daily as needed for anxiety.    Yes [provider]  meloxicam (MOBIC) 15 MG tablet Take 15 mg by mouth daily.   Yes [provider]  metoprolol tartrate 75 MG TABS Take 75 mg by mouth 2 (two) times daily. 04/04/18  Yes Lennette BihariKelly, Thomas A, MD  Multiple Vitamin (MULTIVITAMIN WITH MINERALS) TABS tablet Take 1 tablet by mouth daily.   Yes [provider]  spironolactone (ALDACTONE) 25 MG tablet Take 0.5 tablets (12.5 mg total) by mouth daily. 04/04/18 07/03/18 Yes Lennette BihariKelly, Thomas A, MD  valsartan (DIOVAN) 320 MG tablet Take 320 mg by mouth daily. 02/01/18  Yes [provider]    Inpatient Medications: Scheduled Meds: . enoxaparin (LOVENOX) injection  40 mg Subcutaneous Q24H  . escitalopram  10 mg Oral Daily  . ferrous sulfate  325 mg Oral Q breakfast  . folic acid  1 mg Oral Daily  . hydrochlorothiazide  25 mg Oral Daily  . irbesartan  300 mg Oral Daily  . leflunomide  10 mg Oral Daily  . meloxicam  15 mg Oral Daily  . metoprolol tartrate  50 mg Oral BID  . multivitamin with minerals  1 tablet Oral Daily  . sodium chloride flush  3 mL Intravenous Q12H  . spironolactone  12.5 mg Oral Daily   Continuous Infusions:  PRN Meds: acetaminophen **OR** acetaminophen, LORazepam  Allergies:   No Known Allergies  Social History:   Social History   Socioeconomic History  . Marital status: Widowed    Spouse name: Not on file  . Number of children: Not on file  . Years of education: Not on file  . Highest education level: Not on file  Occupational History  . Occupation: Retired- HaematologistBank Teller  Social Needs  . Financial resource strain: Not on file  . Food insecurity:    Worry: Not on file    Inability: Not on file  . Transportation needs:    Medical: Not on file    Non-medical: Not on file  Tobacco Use  . Smoking status: Never Smoker  . Smokeless tobacco: Never Used  Substance and Sexual Activity  . Alcohol use: No  . Drug  use: No  . Sexual activity: Not on file  Lifestyle  . Physical activity:    Days per week: Not on file    Minutes per session: Not on file  . Stress: Not on file  Relationships  . Social connections:    Talks on phone: Not on file    Gets together: Not on file    Attends religious service: Not on file    Active member of club or organization: Not on file    Attends meetings of clubs or organizations: Not on file    Relationship status:  Not on file  . Intimate partner violence:    Fear of current or ex partner: Not on file    Emotionally abused: Not on file    Physically abused: Not on file    Forced sexual activity: Not on file  Other Topics Concern  . Not on file  Social History Narrative  . Not on file    Family History:    Family History  Problem Relation Age of Onset  . Colon cancer Brother   . Colon cancer Brother   . Lung cancer Sister        never smoked  . Heart disease Mother   . Breast cancer Mother   . Heart attack Sister   . Heart disease Brother   . Breast cancer Sister      ROS:  Please see the history of present illness.   All other ROS reviewed and negative.     Physical Exam/Data:   Vitals:   05/16/18 0859 05/16/18 1131 05/16/18 1134 05/16/18 1505  BP: (!) 184/72 (!) 194/158 (!) 183/76 (!) 155/89  Pulse: 70 73 71 66  Resp: 17 19  16   Temp: 98.3 F (36.8 C) 98.9 F (37.2 C)  98.1 F (36.7 C)  TempSrc: Oral Oral  Oral  SpO2: 97% 96% 97% 98%  Weight:      Height:        Intake/Output Summary (Last 24 hours) at 05/16/2018 1512 Last data filed at 05/16/2018 1500 Gross per 24 hour  Intake 963 ml  Output 2900 ml  Net -1937 ml   Last 3 Weights 05/16/2018 05/15/2018 05/15/2018  Weight (lbs) 187 lb 14.4 oz 190 lb 4 oz 294 lb  Weight (kg) 85.231 kg 86.297 kg 133.358 kg     Body mass index is 30.33 kg/m.  General:  Well nourished, well developed, in no acute distress HEENT: normal Lymph: no adenopathy Neck: no JVD Endocrine:  No  thryomegaly Vascular: No carotid bruits; FA pulses 2+ bilaterally without bruits  Cardiac:  normal S1, S2; RRR with occasional early beat; 1/6 systolic murmur RUSB Lungs:  clear to auscultation bilaterally, no wheezing, rhonchi or rales  Abd: soft, nontender, no hepatomegaly  Ext: Trace ankle edema Musculoskeletal:  No deformities, BUE and BLE strength normal and equal Skin: warm and dry  Neuro:  CNs 2-12 intact, no focal abnormalities noted Psych:  Normal affect   EKG:  The EKG was personally reviewed and demonstrates: Sinus or ectopic atrial rhythm, the P wave at times appears to, after the QRS, 63 bpm Telemetry:  Telemetry was personally reviewed and demonstrates: Sinus rhythm in the 70s  Relevant CV Studies:  Echocardiogram 05/16/2018 Study Conclusions - Left ventricle: The cavity size was normal. Wall thickness was   increased in a pattern of mild LVH. Systolic function was mildly   to moderately reduced. The estimated ejection fraction was in the   range of 40% to 45%. Anterior hypokinesis. Doppler parameters are   consistent with abnormal left ventricular relaxation (grade 1   diastolic dysfunction). The E&'e&' ratio is between 8-15,   suggesting indeterminate LV filling pressure. - Aortic valve: Sclerosis without stenosis. There was mild   regurgitation. - Mitral valve: Mildly thickened leaflets . Mild to moderate   regurgitation. - Left atrium: The atrium was normal in size. - Right atrium: The atrium was mildly dilated. - Atrial septum: A patent foramen ovale cannot be excluded. - Inferior vena cava: The vessel was normal in size. The   respirophasic  diameter changes were in the normal range (= 50%),   consistent with normal central venous pressure.  Impressions: - LVEF 40-45%, mild LVH, anterior hypokinesis, grade 1 DD,   indeterminate LV filling pressure, mild to moderate MR, norma LA   size, mild RAE, cannot exclude PFO, normal IVC.  Echocardiogram 03/18/2018: EF  60-65%, no regional wall motion abnormalities, grade 1 diastolic dysfunction, moderate AI  Laboratory Data:  Chemistry Recent Labs  Lab 05/15/18 1144  NA 134*  K 4.6  CL 102  CO2 19*  GLUCOSE 236*  BUN 13  CREATININE 1.06*  CALCIUM 9.3  GFRNONAA 51*  GFRAA 59*  ANIONGAP 13    Recent Labs  Lab 05/15/18 1144  PROT 6.7  ALBUMIN 3.7  AST 32  ALT 18  ALKPHOS 78  BILITOT 1.1   Hematology Recent Labs  Lab 05/15/18 1144  WBC 9.9  RBC 4.31  HGB 12.7  HCT 41.2  MCV 95.6  MCH 29.5  MCHC 30.8  RDW 14.8  PLT 258   Cardiac EnzymesNo results for input(s): TROPONINI in the last 168 hours.  Recent Labs  Lab 05/15/18 1158  TROPIPOC 0.02    BNPNo results for input(s): BNP, PROBNP in the last 168 hours.  DDimer No results for input(s): DDIMER in the last 168 hours.  Radiology/Studies:  Dg Chest 2 View  Result Date: 05/15/2018 CLINICAL DATA:  Syncope EXAM: CHEST - 2 VIEW COMPARISON:  12/05/2017 FINDINGS: 1259 hours. The cardio pericardial silhouette is enlarged. Pulmonary vascular congestion with diffuse alveolar opacity suggests edema. There is more focal right base atelectasis or infiltrate. The visualized bony structures of the thorax are intact. Telemetry leads overlie the chest. IMPRESSION: 1. Cardiomegaly with diffuse pulmonary edema pattern. 2. Right base atelectasis or infiltrate. Electronically Signed   By: Kennith CenterEric  Mansell M.D.   On: 05/15/2018 13:48   Dg Lumbar Spine 2-3 Views  Result Date: 05/16/2018 CLINICAL DATA:  Low back pain and right-sided sciatica after fall at home yesterday. EXAM: LUMBAR SPINE - 2-3 VIEW COMPARISON:  Lumbar spine radiographs-05/13/2018 FINDINGS: There are 5 non rib-bearing lumbar type vertebral bodies Mild scoliotic curvature the thoracolumbar spine with dominant caudal component convex the right measuring approximately 6 degrees (as measured from the superior endplate of L1 to the inferior endplate of L4). There is mild straightening expected  lumbar lordosis. No anterolisthesis or retrolisthesis. Lumbar vertebral body heights appear preserved Mild multilevel lumbar spine DDD, worse at L2-L3 and L4-L5 with disc space height loss, endplate irregularity and sclerosis. Stigmata of DISH within the lower thoracic spine Limited visualization the bilateral SI joints and hips is normal. Regional bowel gas pattern and soft tissues are normal. IMPRESSION: 1. No acute findings. 2. Mild multilevel lumbar spine DDD, worse at L2-L3 and L4-L5. Electronically Signed   By: Simonne ComeJohn  Watts M.D.   On: 05/16/2018 12:06   Ct Head Wo Contrast  Result Date: 05/15/2018 CLINICAL DATA:  Head injury after fall. EXAM: CT HEAD WITHOUT CONTRAST TECHNIQUE: Contiguous axial images were obtained from the base of the skull through the vertex without intravenous contrast. COMPARISON:  CT scan of May 07, 2013. FINDINGS: Brain: Mild diffuse cortical atrophy. 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 No mass effect or midline shift is noted. Ventricular size is within normal limits. There is no evidence of mass lesion, hemorrhage or acute infarction. Vascular: No hyperdense vessel or unexpected calcification. Skull: Normal. Negative for fracture or focal lesion. Sinuses/Orbits: Left maxillary mucous retention cyst is noted. Other:  None. IMPRESSION: Mild diffuse cortical atrophy. No acute intracranial abnormality seen. Electronically Signed   By: Lupita Raider, M.D.   On: 05/15/2018 12:45   Dg Hip Unilat With Pelvis 1v Right  Result Date: 05/16/2018 CLINICAL DATA:  Post fall, now with back and right-sided sciatic pain. EXAM: DG HIP (WITH OR WITHOUT PELVIS) 1V RIGHT COMPARISON:  05/13/2018 FINDINGS: No fracture or dislocation. Mild degenerative change of the right hip with joint space loss, subchondral sclerosis and osteophytosis. No evidence of avascular necrosis. Limited visualization of the pelvis is normal. Similar mild degenerative change of the contralateral  left hip is suspected though incompletely evaluated. Suspect degenerative change the lower lumbar spine is suspected though incompletely evaluated Punctate phleboliths overlie the lower pelvis bilaterally. Regional soft tissues appear otherwise normal. IMPRESSION: 1. No acute findings. 2. Mild degenerative change of the right hip. Electronically Signed   By: Simonne Come M.D.   On: 05/16/2018 12:08    Assessment and Plan:   1. Syncope -Patient had a fall yesterday and actually denies loss of consciousness.  She started falling about 3 weeks ago and denies any prodromal lightheadedness or dizziness.  She feels like she loses her balance and just goes down.  She has had no chest discomfort.  She has had shortness of breath for several months. -Patient was reportedly bradycardic on arrival.  Metoprolol had been increased in early December with baseline heart rate in the 90s at that time. -No electrolyte abnormalities, kidney dysfunction, leukocytosis or anemia. POC troponin was negative. -Symptoms seemed possibly vasovagal in nature. Orthostatics negative in ED yesterday. -Echo with newly reduced EF -Heart rate in the 70s now off beta-blocker. Metoprolol 50 mg (lower dose) has been resumed. Monitor for bradycardia.  -Consider outpatient monitor to further evaluate for arrhythmic cause of her falls if myocardial ischemia not found to be the cause.   2.  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  Today weight is 85.2 kg. -Patient has had shortness of breath for several months along with mild ankle edema -With her unexplained falls and newly reduced LV function with anterior hypokinesis, will plan for cardiac cath to evaluate coronary arteries.  -Can consider diuresing with IV lasix  if needed once cath is done.   3.  Hypertension -Home medications include valsartan 320 mg daily, hydrochlorothiazide 12.5 mg daily, Spironolactone 12.5 mg daily and metoprolol tartrate 75 mg twice daily -Blood pressure is elevated.  Metoprolol is on hold.  4.  Diabetes type 2 -Not on medications.  No A1c in epic.  Blood sugar yesterday was 236 -Management per PCP   For questions or updates, please contact [facility] Please consult [email or web address] for contact info under     Signed, Berton Bon, NP  05/16/2018 3:12 PM   Agree with note by Berton Bon NP-C  Patient of Dr. Landry Dyke admitted 05/15/2018 with dizziness.  She has been worked up for new onset dyspnea on exertion over the last several months.  She denies chest pain.  She has positive risk factors.  She had a 2D echo performed 2 months ago that showed normal LV function with moderate AI and trivial MR.  However echo performed today showed a decline in EF down to 40 to 45% with an anterior wall motion abnormality.  Chest CT done  4 years ago did show coronary calcification.  Enzymes are negative.  Her EKG shows no acute changes although there did look like there was some junctional rhythm.  I am concerned that her symptoms are ischemically mediated.  She will need a right left heart cath to further evaluate.  Her exam is benign. The patient understands that risks included but are not limited to stroke (1 in 1000), death (1 in 1000), kidney failure [usually temporary] (1 in 500), bleeding (1 in 200), allergic reaction [possibly serious] (1 in 200). The patient understands and agrees to proceed   Runell Gess, M.D., FACP, [facility], Kathryne Eriksson [facility] [address] [place], [place]  [address]  [phone] 05/16/2018 4:35 PM

## 2018-05-16 NOTE — Progress Notes (Signed)
  Echocardiogram 2D Echocardiogram has been performed.  Hareem Surowiec L Androw 05/16/2018, 1:06 PM

## 2018-05-16 NOTE — Evaluation (Signed)
Physical Therapy Evaluation Patient Details Name: Melinda Mann MRN: [id number] DOB: 06/14/1940 Today's Date: 05/16/2018   History of Present Illness  Melinda Mann is a 78 y.o. female with medical history significant of type 2 diabetes, hypertension, history of TIA and diastolic dysfunction who presents to the hospital with multiple episodes of fall at home, losing balance and worsening shortness of breath.  Patient is accompanied by her daughter who is mostly giving history.  According to the patient, has been falling 1 time a day and  is normal for her.  She says she usually falls when she tries to ambulate.  Most of the time this is because of losing balance.  Clinical Impression  Pt admitted with above diagnosis. Pt currently with functional limitations due to the deficits listed below (see PT Problem List). Pt was able to ambulate to bathroom with min guard assist.  BP at rest sitting 156/111 with HR 82 bpm.  In standing, BP 202/86 with HR 87 bpm.  Left pt sitting in transport chair as Xray taking pt to test.  Her BP on departure was 196/66 with HR 87 bpm.  Nurse was made aware and called MD.  She gave some BP Meds before the Xray tech took the pt to test.  Will follow acutely.   Pt will benefit from skilled PT to increase their independence and safety with mobility to allow discharge to the venue listed below.      Follow Up Recommendations Home health PT;Supervision/Assistance - 24 hour    Equipment Recommendations  None recommended by PT    Recommendations for Other Services       Precautions / Restrictions Precautions Precautions: Fall Restrictions Weight Bearing Restrictions: No      Mobility  Bed Mobility Overal bed mobility: Independent                Transfers Overall transfer level: Independent                  Ambulation/Gait Ambulation/Gait assistance: Min guard Gait Distance (Feet): 20 Feet(10 feet x 2) Assistive device: 1 person hand held  assist Gait Pattern/deviations: Step-through pattern;Decreased stride length   Gait velocity interpretation: <1.31 ft/sec, indicative of household ambulator General Gait Details: Pt ambulated to bathroom as she needed to use the bathroom before the X-ray tech took her to procedure.  Pt did not need physical assist but did need steadying assist to feel comfortable.  Pt was able to clean herself. Pt also stopped by sink to wash hands. No significant LOB today with PT.   Stairs            Wheelchair Mobility    Modified Rankin (Stroke Patients Only)       Balance Overall balance assessment: Needs assistance Sitting-balance support: No upper extremity supported;Feet supported Sitting balance-Leahy Scale: Fair     Standing balance support: Single extremity supported;During functional activity Standing balance-Leahy Scale: Fair Standing balance comment: Pt can stand without UE support for up to a minute.  Pt washed hands at sink without LOB.                             Pertinent Vitals/Pain Pain Assessment: Faces Faces Pain Scale: Hurts little more Pain Location: Right hip,thigh to knee  Pain Descriptors / Indicators: Aching;Grimacing;Guarding Pain Intervention(s): Limited activity within patient's tolerance;Monitored during session;Repositioned    Home Living Family/patient expects to be discharged to:: Private residence Living Arrangements:  Children(daughter who is on disability) Available Help at Discharge: Family;Available 24 hours/day Type of Home: House Home Access: Stairs to enter Entrance Stairs-Rails: Right;Left;Can reach both Entrance Stairs-Number of Steps: 2 Home Layout: One level Home Equipment: Walker - 2 wheels;Walker - 4 wheels;Cane - single point;Wheelchair - manual;Hand held shower head;Grab bars - tub/shower;Toilet riser;Shower seat      Prior Function Level of Independence: Independent with assistive device(s);Needs assistance   Gait /  Transfers Assistance Needed: pt states she use RW at times, other times did not but has been falling  ADL's / Homemaking Assistance Needed: independent        Hand Dominance        Extremity/Trunk Assessment   Upper Extremity Assessment Upper Extremity Assessment: Defer to OT evaluation    Lower Extremity Assessment Lower Extremity Assessment: Generalized weakness    Cervical / Trunk Assessment Cervical / Trunk Assessment: Normal  Communication   Communication: No difficulties  Cognition Arousal/Alertness: Awake/alert Behavior During Therapy: WFL for tasks assessed/performed Overall Cognitive Status: Within Functional Limits for tasks assessed                                        General Comments      Exercises General Exercises - Lower Extremity Ankle Circles/Pumps: AROM;Both;10 reps;Supine Long Arc Quad: AROM;Both;10 reps;Seated   Assessment/Plan    PT Assessment Patient needs continued PT services  PT Problem List Decreased activity tolerance;Decreased balance;Decreased mobility;Decreased knowledge of use of DME;Decreased safety awareness;Decreased knowledge of precautions;Pain       PT Treatment Interventions DME instruction;Gait training;Functional mobility training;Therapeutic activities;Therapeutic exercise;Balance training;Stair training;Patient/family education    PT Goals (Current goals can be found in the Care Plan section)  Acute Rehab PT Goals Patient Stated Goal: to go home PT Goal Formulation: With patient Time For Goal Achievement: 05/30/18 Potential to Achieve Goals: Good    Frequency Min 3X/week   Barriers to discharge        Co-evaluation               AM-PAC PT "6 Clicks" Mobility  Outcome Measure Help needed turning from your back to your side while in a flat bed without using bedrails?: None Help needed moving from lying on your back to sitting on the side of a flat bed without using bedrails?: None Help  needed moving to and from a bed to a chair (including a wheelchair)?: None Help needed standing up from a chair using your arms (e.g., wheelchair or bedside chair)?: A Little Help needed to walk in hospital room?: A Little Help needed climbing 3-5 steps with a railing? : A Little 6 Click Score: 21    End of Session Equipment Utilized During Treatment: Gait belt Activity Tolerance: Patient tolerated treatment well Patient left: in chair;with call bell/phone within reach Nurse Communication: Mobility status PT Visit Diagnosis: Unsteadiness on feet (R26.81);Muscle weakness (generalized) (M62.81);Pain Pain - Right/Left: Right Pain - part of body: Hip;Knee    Time: 3007-6226 PT Time Calculation (min) (ACUTE ONLY): 37 min   Charges:   PT Evaluation $PT Eval Moderate Complexity: 1 Mod PT Treatments $Gait Training: 8-22 mins        Brenton Joines,PT Acute Rehabilitation Services Pager:  [phone]  Office:  [phone]    Berline Lopes 05/16/2018, 1:19 PM

## 2018-05-16 NOTE — Progress Notes (Signed)
PROGRESS NOTE                                                                                                                                                                                                             Patient Demographics:    Melinda Mann, is a 78 y.o. female, DOB - 1940/12/09, ZOX:[id number]  Admit date - 05/15/2018   Admitting Physician Dorcas CarrowKuber Ghimire, MD  Outpatient Primary MD for the patient is Irena Reichmannollins, Dana, DO  LOS - 0  Outpatient Specialists: Dr. Tresa EndoKelly (cardiology)  Chief Complaint  Patient presents with  . Loss of Consciousness  . Bradycardia       Brief Narrative 78 year old female with type 2 diabetes mellitus, hypertension, history of TIA and diastolic dysfunction presented to the ED with multiple episodes of fall at home with unsteady gait and worsening shortness of breath.  Patient denies loss of consciousness, chest pain, shortness of breath, palpitations, dizziness or lightheadedness during these episodes. In the ED blood pressure was stable but was found to be bradycardic in the 40s. Observe for further management   Subjective:   Patient complains of right-sided hip pain radiating down to her right thigh.  Reports this has been ongoing for almost 2 weeks.   Assessment  & Plan :    Principal Problem:   Syncope and collapse Suspected to be vasovagal on presentation.  Orthostasis negative. Patient also bradycardic, metoprolol dose increased by cardiologist to 75 mg twice daily (was on 50 mg twice daily) about 5 weeks back for elevated blood pressure. CT head negative.  No acute injuries on skeletal x-rays.  No EKG changes noted. Troponin in the ED negative.  Heart rate improved on the monitor after holding beta-blocker. 2D echo repeated (last done 2 months back) showing worsened EF of 40-45% with anterior hypokinesis.  These changes are new as echo 2 months back showed normal EF and no  wall motion abnormality.  Active Problems: Essential hypertension Blood pressure elevated.  Continue Avapro.  Metoprolol resumed at 50 mg twice daily    History of TIA (transient ischemic attack) Not on any meds.  Right hip pain/sciatica X-ray of the hip unremarkable.  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  PT recommends home health.  Pain control with Tylenol and daily meloxicam.  Diabetes mellitus type 2 Not on home meds.  CBGs in 200.  Monitor on sliding scale coverage.  Code Status : Full code  Family Communication  : None at bedside  Disposition Plan  : Home pending cardiology evaluation.  Barriers For Discharge : Active symptoms  Consults  :  cardiology  Procedures  : 2D echo, head CT  DVT Prophylaxis  :  Lovenox   Lab Results  Component Value Date   PLT 258 05/15/2018    Antibiotics  :    Anti-infectives (From admission, onward)   None        Objective:   Vitals:   05/16/18 0514 05/16/18 0859 05/16/18 1131 05/16/18 1134  BP: (!) 171/71 (!) 184/72 (!) 194/158 (!) 183/76  Pulse: 66 70 73 71  Resp: 20 17 19    Temp: 97.7 F (36.5 C) 98.3 F (36.8 C) 98.9 F (37.2 C)   TempSrc: Oral Oral Oral   SpO2: 94% 97% 96% 97%  Weight: 85.2 kg     Height:        Wt Readings from Last 3 Encounters:  05/16/18 85.2 kg  04/04/18 88.4 kg  02/28/18 88.8 kg     Intake/Output Summary (Last 24 hours) at 05/16/2018 1439 Last data filed at 05/16/2018 1300 Gross per 24 hour  Intake 483 ml  Output 2600 ml  Net -2117 ml     Physical Exam  Gen: not in distress HEENT: no pallor, moist mucosa, supple neck Chest: clear b/l, no added sounds CVS: N S1&S2, no murmurs, rubs or gallop GI: soft, NT, ND, BS+ Musculoskeletal: warm, no edema     Data Review:    CBC Recent Labs  Lab 05/15/18 1144  WBC 9.9  HGB 12.7  HCT 41.2  PLT 258  MCV 95.6  MCH 29.5  MCHC 30.8  RDW 14.8  LYMPHSABS 1.1  MONOABS 0.3  EOSABS 0.3  BASOSABS 0.0    Chemistries    Recent Labs  Lab 05/15/18 1144  NA 134*  K 4.6  CL 102  CO2 19*  GLUCOSE 236*  BUN 13  CREATININE 1.06*  CALCIUM 9.3  AST 32  ALT 18  ALKPHOS 78  BILITOT 1.1   ------------------------------------------------------------------------------------------------------------------ No results for input(s): CHOL, HDL, LDLCALC, TRIG, CHOLHDL, LDLDIRECT in the last 72 hours.  No results found for: HGBA1C ------------------------------------------------------------------------------------------------------------------ No results for input(s): TSH, T4TOTAL, T3FREE, THYROIDAB in the last 72 hours.  Invalid input(s): FREET3 ------------------------------------------------------------------------------------------------------------------ No results for input(s): VITAMINB12, FOLATE, FERRITIN, TIBC, IRON, RETICCTPCT in the last 72 hours.  Coagulation profile No results for input(s): INR, PROTIME in the last 168 hours.  No results for input(s): DDIMER in the last 72 hours.  Cardiac Enzymes No results for input(s): CKMB, TROPONINI, MYOGLOBIN in the last 168 hours.  Invalid input(s): CK ------------------------------------------------------------------------------------------------------------------    Component Value Date/Time   BNP 246.1 (H) 03/14/2018 1028    Inpatient Medications  Scheduled Meds: . enoxaparin (LOVENOX) injection  40 mg Subcutaneous Q24H  . escitalopram  10 mg Oral Daily  . ferrous sulfate  325 mg Oral Q breakfast  . folic acid  1 mg Oral Daily  . irbesartan  300 mg Oral Daily  . leflunomide  10 mg Oral Daily  . meloxicam  15 mg Oral Daily  . metoprolol tartrate  50 mg Oral BID  . multivitamin with minerals  1 tablet Oral Daily  . sodium chloride flush  3 mL Intravenous Q12H  . spironolactone  12.5 mg Oral Daily   Continuous Infusions: PRN Meds:.acetaminophen **OR** acetaminophen, LORazepam  Micro Results No results found for this or any previous visit  (from the past 240 hour(s)).  Radiology Reports Dg Chest 2 View  Result Date: 05/15/2018 CLINICAL DATA:  Syncope EXAM: CHEST - 2 VIEW COMPARISON:  12/05/2017 FINDINGS: 1259 hours. The cardio pericardial silhouette is enlarged. Pulmonary vascular congestion with diffuse alveolar opacity suggests edema. There is more focal right base atelectasis or infiltrate. The visualized bony structures of the thorax are intact. Telemetry leads overlie the chest. IMPRESSION: 1. Cardiomegaly with diffuse pulmonary edema pattern. 2. Right base atelectasis or infiltrate. Electronically Signed   By: Kennith Center M.D.   On: 05/15/2018 13:48   Dg Lumbar Spine 2-3 Views  Result Date: 05/16/2018 CLINICAL DATA:  Low back pain and right-sided sciatica after fall at home yesterday. EXAM: LUMBAR SPINE - 2-3 VIEW COMPARISON:  Lumbar spine radiographs-05/13/2018 FINDINGS: There are 5 non rib-bearing lumbar type vertebral bodies Mild scoliotic curvature the thoracolumbar spine with dominant caudal component convex the right measuring approximately 6 degrees (as measured from the superior endplate of L1 to the inferior endplate of L4). There is mild straightening expected lumbar lordosis. No anterolisthesis or retrolisthesis. Lumbar vertebral body heights appear preserved Mild multilevel lumbar spine DDD, worse at L2-L3 and L4-L5 with disc space height loss, endplate irregularity and sclerosis. Stigmata of DISH within the lower thoracic spine Limited visualization the bilateral SI joints and hips is normal. Regional bowel gas pattern and soft tissues are normal. IMPRESSION: 1. No acute findings. 2. Mild multilevel lumbar spine DDD, worse at L2-L3 and L4-L5. Electronically Signed   By: Simonne Come M.D.   On: 05/16/2018 12:06   Ct Head Wo Contrast  Result Date: 05/15/2018 CLINICAL DATA:  Head injury after fall. EXAM: CT HEAD WITHOUT CONTRAST TECHNIQUE: Contiguous axial images were obtained from the base of the skull through the vertex  without intravenous contrast. COMPARISON:  CT scan of May 07, 2013. FINDINGS: Brain: Mild diffuse cortical atrophy. 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 No mass effect or midline shift is noted. Ventricular size is within normal limits. There is no evidence of mass lesion, hemorrhage or acute infarction. Vascular: No hyperdense vessel or unexpected calcification. Skull: Normal. Negative for fracture or focal lesion. Sinuses/Orbits: Left maxillary mucous retention cyst is noted. Other: None. IMPRESSION: Mild diffuse cortical atrophy. No acute intracranial abnormality seen. Electronically Signed   By: Lupita Raider, M.D.   On: 05/15/2018 12:45   Dg Hip Unilat With Pelvis 1v Right  Result Date: 05/16/2018 CLINICAL DATA:  Post fall, now with back and right-sided sciatic pain. EXAM: DG HIP (WITH OR WITHOUT PELVIS) 1V RIGHT COMPARISON:  05/13/2018 FINDINGS: No fracture or dislocation. Mild degenerative change of the right hip with joint space loss, subchondral sclerosis and osteophytosis. No evidence of avascular necrosis. Limited visualization of the pelvis is normal. Similar mild degenerative change of the contralateral left hip is suspected though incompletely evaluated. Suspect degenerative change the lower lumbar spine is suspected though incompletely evaluated Punctate phleboliths overlie the lower pelvis bilaterally. Regional soft tissues appear otherwise normal. IMPRESSION: 1. No acute findings. 2. Mild degenerative change of the right hip. Electronically Signed   By: Simonne Come M.D.   On: 05/16/2018 12:08    Time Spent in minutes  25   Cindia Hustead M.D on 05/16/2018 at 2:39 PM  Between 7am to 7pm - Pager - [phone]  After 7pm go to [email or web address] - password [facility]  [facility] -  Office  [phone]

## 2018-05-17 ENCOUNTER — Encounter (HOSPITAL_COMMUNITY): Payer: Self-pay | Admitting: Interventional Cardiology

## 2018-05-17 ENCOUNTER — Encounter (HOSPITAL_COMMUNITY)
Admission: EM | Disposition: A | Discharge status: Home / Self Care | Payer: Self-pay | Source: Home / Self Care | Attending: Emergency Medicine

## 2018-05-17 DIAGNOSIS — R931 Abnormal findings on diagnostic imaging of heart and coronary circulation: Secondary | ICD-10-CM | POA: Diagnosis not present

## 2018-05-17 DIAGNOSIS — I10 Essential (primary) hypertension: Secondary | ICD-10-CM

## 2018-05-17 DIAGNOSIS — M5441 Lumbago with sciatica, right side: Secondary | ICD-10-CM | POA: Diagnosis not present

## 2018-05-17 DIAGNOSIS — G8929 Other chronic pain: Secondary | ICD-10-CM

## 2018-05-17 DIAGNOSIS — E559 Vitamin D deficiency, unspecified: Secondary | ICD-10-CM

## 2018-05-17 DIAGNOSIS — R55 Syncope and collapse: Secondary | ICD-10-CM | POA: Diagnosis not present

## 2018-05-17 DIAGNOSIS — I519 Heart disease, unspecified: Secondary | ICD-10-CM

## 2018-05-17 HISTORY — PX: LEFT HEART CATH AND CORONARY ANGIOGRAPHY: CATH118249

## 2018-05-17 LAB — VITAMIN D 25 HYDROXY (VIT D DEFICIENCY, FRACTURES): Vit D, 25-Hydroxy: 19.4 ng/mL — ABNORMAL LOW (ref 30.0–100.0)

## 2018-05-17 LAB — GLUCOSE, CAPILLARY: Glucose-Capillary: 120 mg/dL — ABNORMAL HIGH (ref 70–99)

## 2018-05-17 SURGERY — LEFT HEART CATH AND CORONARY ANGIOGRAPHY
Anesthesia: LOCAL

## 2018-05-17 MED ORDER — LIDOCAINE HCL (PF) 1 % IJ SOLN
INTRAMUSCULAR | Status: DC | PRN
  Administered 2018-05-17: 2 mL via INTRADERMAL

## 2018-05-17 MED ORDER — SODIUM CHLORIDE 0.9 % IV SOLN: 250.0000 mL | INTRAVENOUS | Status: DC | PRN

## 2018-05-17 MED ORDER — METOPROLOL TARTRATE 50 MG PO TABS: 25.0000 mg | ORAL_TABLET | Freq: Two times a day (BID) | ORAL | 0 refills | Status: DC

## 2018-05-17 MED ORDER — FENTANYL CITRATE (PF) 100 MCG/2ML IJ SOLN
INTRAMUSCULAR | Status: AC
  Filled 2018-05-17: qty 2

## 2018-05-17 MED ORDER — FENTANYL CITRATE (PF) 100 MCG/2ML IJ SOLN
INTRAMUSCULAR | Status: DC | PRN
  Administered 2018-05-17: 25 ug via INTRAVENOUS

## 2018-05-17 MED ORDER — LIDOCAINE HCL (PF) 1 % IJ SOLN
INTRAMUSCULAR | Status: AC
  Filled 2018-05-17: qty 30

## 2018-05-17 MED ORDER — HEPARIN SODIUM (PORCINE) 1000 UNIT/ML IJ SOLN
INTRAMUSCULAR | Status: AC
  Filled 2018-05-17: qty 1

## 2018-05-17 MED ORDER — SODIUM CHLORIDE 0.9% FLUSH
3.0000 mL | Freq: Two times a day (BID) | INTRAVENOUS | Status: DC
  Administered 2018-05-17: 3 mL via INTRAVENOUS

## 2018-05-17 MED ORDER — VITAMIN D (ERGOCALCIFEROL) 1.25 MG (50000 UNIT) PO CAPS: 50000.0000 [IU] | ORAL_CAPSULE | ORAL | 0 refills | Status: AC

## 2018-05-17 MED ORDER — HEPARIN (PORCINE) IN NACL 1000-0.9 UT/500ML-% IV SOLN
INTRAVENOUS | Status: DC | PRN
  Administered 2018-05-17 (×2): 500 mL

## 2018-05-17 MED ORDER — MIDAZOLAM HCL 2 MG/2ML IJ SOLN
INTRAMUSCULAR | Status: AC
  Filled 2018-05-17: qty 2

## 2018-05-17 MED ORDER — HEPARIN SODIUM (PORCINE) 1000 UNIT/ML IJ SOLN
INTRAMUSCULAR | Status: DC | PRN
  Administered 2018-05-17: 4000 [IU] via INTRAVENOUS

## 2018-05-17 MED ORDER — SODIUM CHLORIDE 0.9 % IV SOLN
INTRAVENOUS | Status: AC
  Administered 2018-05-17: 12:00:00 via INTRAVENOUS

## 2018-05-17 MED ORDER — VERAPAMIL HCL 2.5 MG/ML IV SOLN
INTRAVENOUS | Status: DC | PRN
  Administered 2018-05-17: 10 mL via INTRA_ARTERIAL

## 2018-05-17 MED ORDER — ONDANSETRON HCL 4 MG/2ML IJ SOLN: 4.0000 mg | Freq: Four times a day (QID) | INTRAMUSCULAR | Status: DC | PRN

## 2018-05-17 MED ORDER — FUROSEMIDE 10 MG/ML IJ SOLN
20.0000 mg | Freq: Once | INTRAMUSCULAR | Status: AC
  Administered 2018-05-17: 20 mg via INTRAVENOUS
  Filled 2018-05-17: qty 2

## 2018-05-17 MED ORDER — IOHEXOL 350 MG/ML SOLN
INTRAVENOUS | Status: DC | PRN
  Administered 2018-05-17: 60 mL via INTRA_ARTERIAL

## 2018-05-17 MED ORDER — HEPARIN (PORCINE) IN NACL 1000-0.9 UT/500ML-% IV SOLN
INTRAVENOUS | Status: AC
  Filled 2018-05-17: qty 1000

## 2018-05-17 MED ORDER — SODIUM CHLORIDE 0.9% FLUSH: 3.0000 mL | INTRAVENOUS | Status: DC | PRN

## 2018-05-17 MED ORDER — MIDAZOLAM HCL 2 MG/2ML IJ SOLN
INTRAMUSCULAR | Status: DC | PRN
  Administered 2018-05-17: 2 mg via INTRAVENOUS

## 2018-05-17 MED ORDER — HYDRALAZINE HCL 20 MG/ML IJ SOLN
10.0000 mg | Freq: Once | INTRAMUSCULAR | Status: AC
  Administered 2018-05-17: 10 mg via INTRAVENOUS
  Filled 2018-05-17: qty 1

## 2018-05-17 MED ORDER — ACETAMINOPHEN 325 MG PO TABS: 650.0000 mg | ORAL_TABLET | ORAL | Status: DC | PRN

## 2018-05-17 MED ORDER — VERAPAMIL HCL 2.5 MG/ML IV SOLN
INTRAVENOUS | Status: AC
  Filled 2018-05-17: qty 2

## 2018-05-17 MED ORDER — HYDROCHLOROTHIAZIDE 12.5 MG PO TABS: 25.0000 mg | ORAL_TABLET | Freq: Every day | ORAL | 2 refills | Status: AC

## 2018-05-17 SURGICAL SUPPLY — 10 items
CATH 5FR JL3.5 JR4 ANG PIG MP (CATHETERS) ×1 IMPLANT
DEVICE RAD COMP TR BAND LRG (VASCULAR PRODUCTS) ×1 IMPLANT
GLIDESHEATH SLEND SS 6F .021 (SHEATH) ×1 IMPLANT
GUIDEWIRE INQWIRE 1.5J.035X260 (WIRE) IMPLANT
INQWIRE 1.5J .035X260CM (WIRE) ×2
KIT HEART LEFT (KITS) ×2 IMPLANT
PACK CARDIAC CATHETERIZATION (CUSTOM PROCEDURE TRAY) ×2 IMPLANT
SHEATH PROBE COVER 6X72 (BAG) ×1 IMPLANT
TRANSDUCER W/STOPCOCK (MISCELLANEOUS) ×2 IMPLANT
TUBING CIL FLEX 10 FLL-RA (TUBING) ×2 IMPLANT

## 2018-05-17 NOTE — Progress Notes (Signed)
Paged provider on call re: discharge. Pt has no ride, she called neighbors but no answer, social worker who was at [facility] and was going to send someone over from [facility] to talk with pt. Pt anxious to go home and discussed plan was for 24 hour care and risk for falls. Await call back from provider

## 2018-05-17 NOTE — Progress Notes (Signed)
Pt informed nurse she will need to get a ride home as her daughter is not avaiible until after 10pm " maybe later like 3 if I know my daughter" paged on call social worker ot talk with pt to assess if could get cab voucher, pt and I discussed home health and recs. She agreed to have them come since they can work on Engineer, manufacturing systems and energy conservation, left message for ED social worker on call

## 2018-05-17 NOTE — Progress Notes (Signed)
Cardiac catheterization this morning showed no significant CAD with low normal LV function.  Based on this, patient can be discharged home and follow-up with Dr. Tresa Endo as an outpatient.  Runell Gess, M.D., FACP, [facility], [facility] [address] [place], [place]  [address]  [phone] 05/17/2018 1:55 PM

## 2018-05-17 NOTE — Progress Notes (Signed)
Physical Therapy Treatment Patient Details Name: Melinda Mann MRN: [id number] DOB: 27-Sep-1940 Today's Date: 05/17/2018    History of Present Illness Pt is a 78 y.o. female admitted 05/15/18 with worsening SOB and multiple falls at home. Workup for syncope of unclear etiology; also with CHF (EF currently 40-45% with anterior hypokinesia). Plan for cardiac cath 1/14. PMH includes DM2, HTN, TIA, diastolic dysfunction.   PT Comments    Pt progressing with mobility, ambulating at supervision-level with RW. DOE 3/4 after hallway ambulation requiring seated rest break; SpO2 99% on RA, HR 60s, but pt hypertensive (see values below). Pt reports this is similar to SOB that occurs with mobility at home. Increased time spent discussing energy conservation strategies and safety/fall risk reduction strategies. Pt anxious for cardiac cath today.  Blood Pressure Values  Supine 189/68  Sitting 197/76  Standing 199/77  Post-ambulation 195/86     Follow Up Recommendations  Home health PT;Supervision for mobility/OOB     Equipment Recommendations  None recommended by PT    Recommendations for Other Services       Precautions / Restrictions Precautions Precautions: Fall Precaution Comments: Hypertensive Restrictions Weight Bearing Restrictions: No    Mobility  Bed Mobility Overal bed mobility: Independent                Transfers Overall transfer level: Independent               General transfer comment: Indep standing from bed and toilet without device; also good technique when performing with RW  Ambulation/Gait Ambulation/Gait assistance: Supervision Gait Distance (Feet): 180 Feet Assistive device: Rolling walker (2 wheeled) Gait Pattern/deviations: Step-through pattern;Decreased stride length   Gait velocity interpretation: 1.31 - 2.62 ft/sec, indicative of limited community ambulator General Gait Details: Steady gait, ambulating to bathroom, then in hallway with RW  at supervision-level. DOE 3/4 requiring seated rest break; SpO2 99%, HR 64, but BP 195/86 post-ambulation   Stairs             Wheelchair Mobility    Modified Rankin (Stroke Patients Only)       Balance Overall balance assessment: Needs assistance Sitting-balance support: No upper extremity supported;Feet supported Sitting balance-Leahy Scale: Good     Standing balance support: Single extremity supported;During functional activity Standing balance-Leahy Scale: Fair Standing balance comment: Can static stand and take steps without UE support; dynamic stability improved with RW                            Cognition Arousal/Alertness: Awake/alert Behavior During Therapy: WFL for tasks assessed/performed Overall Cognitive Status: Within Functional Limits for tasks assessed                                        Exercises      General Comments        Pertinent Vitals/Pain Pain Assessment: No/denies pain    Home Living                      Prior Function            PT Goals (current goals can now be found in the care plan section) Acute Rehab PT Goals Patient Stated Goal: to go home PT Goal Formulation: With patient Time For Goal Achievement: 05/30/18 Potential to Achieve Goals: Good Progress towards PT goals: Progressing toward  goals    Frequency    Min 3X/week      PT Plan Current plan remains appropriate    Co-evaluation              AM-PAC PT "6 Clicks" Mobility   Outcome Measure  Help needed turning from your back to your side while in a flat bed without using bedrails?: None Help needed moving from lying on your back to sitting on the side of a flat bed without using bedrails?: None Help needed moving to and from a bed to a chair (including a wheelchair)?: None Help needed standing up from a chair using your arms (e.g., wheelchair or bedside chair)?: A Little Help needed to walk in hospital room?:  A Little Help needed climbing 3-5 steps with a railing? : A Little 6 Click Score: 21    End of Session Equipment Utilized During Treatment: Gait belt Activity Tolerance: Patient tolerated treatment well Patient left: in bed;with call bell/phone within reach Nurse Communication: Mobility status PT Visit Diagnosis: Unsteadiness on feet (R26.81);Muscle weakness (generalized) (M62.81)     Time: [id number] PT Time Calculation (min) (ACUTE ONLY): 25 min  Charges:  $Gait Training: 8-22 mins $Self Care/Home Management: 8-22                    Ina HomesJaclyn Erma Raiche, PT, DPT Acute Rehabilitation Services  Pager [phone] Office [phone]  Malachy ChamberJaclyn L Amarri Michaelson 05/17/2018, 10:17 AM

## 2018-05-17 NOTE — H&P (View-Only) (Signed)
Progress Note  Patient Name: SUCELY ZIEGLER Date of Encounter: 05/17/2018  Primary Cardiologist: Nicki Guadalajara, MD   Subjective   No chest pain or shortness of breath today.  Scheduled for cardiac catheterization later this morning  Inpatient Medications    Scheduled Meds: . enoxaparin (LOVENOX) injection  40 mg Subcutaneous Q24H  . escitalopram  10 mg Oral Daily  . ferrous sulfate  325 mg Oral Q breakfast  . folic acid  1 mg Oral Daily  . hydrochlorothiazide  25 mg Oral Daily  . irbesartan  300 mg Oral Daily  . leflunomide  10 mg Oral Daily  . meloxicam  15 mg Oral Daily  . metoprolol tartrate  50 mg Oral BID  . multivitamin with minerals  1 tablet Oral Daily  . sodium chloride flush  3 mL Intravenous Q12H  . sodium chloride flush  3 mL Intravenous Q12H  . spironolactone  12.5 mg Oral Daily   Continuous Infusions: . sodium chloride    . sodium chloride 1 mL/kg/hr (05/17/18 0629)   PRN Meds: sodium chloride, acetaminophen **OR** acetaminophen, LORazepam, sodium chloride flush   Vital Signs    Vitals:   05/16/18 2353 05/17/18 0000 05/17/18 0431 05/17/18 0844  BP: (!) 189/93 (!) 189/83 (!) 185/82 (!) 171/63  Pulse: 67 64 63 66  Resp: 18  16   Temp: 97.6 F (36.4 C)  97.6 F (36.4 C)   TempSrc: Oral  Oral   SpO2: 98%  96% 95%  Weight:   84 kg   Height:        Intake/Output Summary (Last 24 hours) at 05/17/2018 1004 Last data filed at 05/17/2018 0847 Gross per 24 hour  Intake 1787.68 ml  Output 2502 ml  Net -714.32 ml   Last 3 Weights 05/17/2018 05/16/2018 05/15/2018  Weight (lbs) 185 lb 3.2 oz 187 lb 14.4 oz 190 lb 4 oz  Weight (kg) 84.006 kg 85.231 kg 86.297 kg      Telemetry    Sinus rhythm with PVCs- Personally Reviewed  ECG    Not performed today- Personally Reviewed  Physical Exam   GEN: No acute distress.   Neck: No JVD Cardiac: RRR, no murmurs, rubs, or gallops.  Respiratory: Clear to auscultation bilaterally. GI: Soft, nontender,  non-distended  MS: No edema; No deformity. Neuro:  Nonfocal  Psych: Normal affect   Labs    Chemistry Recent Labs  Lab 05/15/18 1144  NA 134*  K 4.6  CL 102  CO2 19*  GLUCOSE 236*  BUN 13  CREATININE 1.06*  CALCIUM 9.3  PROT 6.7  ALBUMIN 3.7  AST 32  ALT 18  ALKPHOS 78  BILITOT 1.1  GFRNONAA 51*  GFRAA 59*  ANIONGAP 13     Hematology Recent Labs  Lab 05/15/18 1144  WBC 9.9  RBC 4.31  HGB 12.7  HCT 41.2  MCV 95.6  MCH 29.5  MCHC 30.8  RDW 14.8  PLT 258    Cardiac EnzymesNo results for input(s): TROPONINI in the last 168 hours.  Recent Labs  Lab 05/15/18 1158  TROPIPOC 0.02     BNPNo results for input(s): BNP, PROBNP in the last 168 hours.   DDimer No results for input(s): DDIMER in the last 168 hours.   Radiology    Dg Chest 2 View  Result Date: 05/15/2018 CLINICAL DATA:  Syncope EXAM: CHEST - 2 VIEW COMPARISON:  12/05/2017 FINDINGS: 1259 hours. The cardio pericardial silhouette is enlarged. Pulmonary vascular congestion with diffuse alveolar  opacity suggests edema. There is more focal right base atelectasis or infiltrate. The visualized bony structures of the thorax are intact. Telemetry leads overlie the chest. IMPRESSION: 1. Cardiomegaly with diffuse pulmonary edema pattern. 2. Right base atelectasis or infiltrate. Electronically Signed   By: Eric  Mansell M.D.   On: 05/15/2018 13:48   Dg Lumbar Spine 2-3 Views  Result Date: 05/16/2018 CLINICAL DATA:  Low back pain and right-sided sciatica after fall at home yesterday. EXAM: LUMBAR SPINE - 2-3 VIEW COMPARISON:  Lumbar spine radiographs-05/13/2018 FINDINGS: There are 5 non rib-bearing lumbar type vertebral bodies Mild scoliotic curvature the thoracolumbar spine with dominant caudal component convex the right measuring approximately 6 degrees (as measured from the superior endplate of L1 to the inferior endplate of L4). There is mild straightening expected lumbar lordosis. No anterolisthesis or  retrolisthesis. Lumbar vertebral body heights appear preserved Mild multilevel lumbar spine DDD, worse at L2-L3 and L4-L5 with disc space height loss, endplate irregularity and sclerosis. Stigmata of DISH within the lower thoracic spine Limited visualization the bilateral SI joints and hips is normal. Regional bowel gas pattern and soft tissues are normal. IMPRESSION: 1. No acute findings. 2. Mild multilevel lumbar spine DDD, worse at L2-L3 and L4-L5. Electronically Signed   By: John  Watts M.D.   On: 05/16/2018 12:06   Ct Head Wo Contrast  Result Date: 05/15/2018 CLINICAL DATA:  Head injury after fall. EXAM: CT HEAD WITHOUT CONTRAST TECHNIQUE: Contiguous axial images were obtained from the base of the skull through the vertex without intravenous contrast. COMPARISON:  CT scan of May 07, 2013. FINDINGS: Brain: Mild diffuse cortical atrophy. 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 No mass effect or midline shift is noted. Ventricular size is within normal limits. There is no evidence of mass lesion, hemorrhage or acute infarction. Vascular: No hyperdense vessel or unexpected calcification. Skull: Normal. Negative for fracture or focal lesion. Sinuses/Orbits: Left maxillary mucous retention cyst is noted. Other: None. IMPRESSION: Mild diffuse cortical atrophy. No acute intracranial abnormality seen. Electronically Signed   By: James  Green Jr, M.D.   On: 05/15/2018 12:45   Dg Hip Unilat With Pelvis 1v Right  Result Date: 05/16/2018 CLINICAL DATA:  Post fall, now with back and right-sided sciatic pain. EXAM: DG HIP (WITH OR WITHOUT PELVIS) 1V RIGHT COMPARISON:  05/13/2018 FINDINGS: No fracture or dislocation. Mild degenerative change of the right hip with joint space loss, subchondral sclerosis and osteophytosis. No evidence of avascular necrosis. Limited visualization of the pelvis is normal. Similar mild degenerative change of the contralateral left hip is suspected though  incompletely evaluated. Suspect degenerative change the lower lumbar spine is suspected though incompletely evaluated Punctate phleboliths overlie the lower pelvis bilaterally. Regional soft tissues appear otherwise normal. IMPRESSION: 1. No acute findings. 2. Mild degenerative change of the right hip. Electronically Signed   By: John  Watts M.D.   On: 05/16/2018 12:08    Cardiac Studies   2D echocardiogram (05/16/2018)   - Left ventricle: The cavity size was normal. Wall thickness was   increased in a pattern of mild LVH. Systolic function was mildly   to moderately reduced. The estimated ejection fraction was in the   range of 40% to 45%. Anterior hypokinesis. Doppler parameters are   consistent with abnormal left ventricular relaxation (grade 1   diastolic dysfunction). The E&'e&' ratio is between 8-15,   suggesting indeterminate LV filling pressure. - Aortic valve: Sclerosis without stenosis. There was mild   regurgitation. -   Mitral valve: Mildly thickened leaflets . Mild to moderate   regurgitation. - Left atrium: The atrium was normal in size. - Right atrium: The atrium was mildly dilated. - Atrial septum: A patent foramen ovale cannot be excluded. - Inferior vena cava: The vessel was normal in size. The   respirophasic diameter changes were in the normal range (= 50%),   consistent with normal central venous pressure.  Impressions:  - LVEF 40-45%, mild LVH, anterior hypokinesis, grade 1 DD,   indeterminate LV filling pressure, mild to moderate MR, norma LA   size, mild RAE, cannot exclude PFO, normal IVC.  Patient Profile     Acey LavMargie M Gaillard is a 78 y.o. female with a hx of diabetes type 2, hypertension, History of TIA and diastolic dysfunction who is being seen for the evaluation of syncope and decreased EF at the request of Dr. Gonzella Lexhungel.  Her major complaints are of increasing dyspnea on exertion of the last 3 months.  A 2D echo performed yesterday showed decline in LV  function down to 40 to 45% with an anterior wall motion abnormality.  Assessment & Plan    1: Syncope- unclear etiology.  She did have some junctional rhythm.  Recommend outpatient monitor.  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  Because of her EF decline and wall motion normalities unclear although this may be ischemically mediated and contributing to her dyspnea.  She is scheduled for cardiac catheterization today.  3: Essential hypertension- metoprolol on hold.  Blood pressure elevated.  Will need to titrate antihypertensive medications.       For questions or updates, please contact [facility] Please consult [email or web address] for contact info under        Signed, Nanetta BattyJonathan Shyheem Whitham, MD  05/17/2018, 10:04 AM

## 2018-05-17 NOTE — Progress Notes (Signed)
Pt to cath lab in bed, stable condtion, no chest pain, void prior to leaving. ccmd called to notify off unti

## 2018-05-17 NOTE — Progress Notes (Signed)
Left message for daughter as she did not answer, spoke to caroloyn her friend who come pick up patient

## 2018-05-17 NOTE — Interval H&P Note (Signed)
Cath Lab Visit (complete for each Cath Lab visit)  Clinical Evaluation Leading to the Procedure:   ACS: Yes.    Non-ACS:    Anginal Classification: CCS IV  Anti-ischemic medical therapy: Minimal Therapy (1 class of medications)  Non-Invasive Test Results: Intermediate-risk stress test findings: cardiac mortality 1-3%/year  Prior CABG: No previous CABG  Decreased EF by echo    History and Physical Interval Note:  05/17/2018 11:00 AM  Melinda Mann  has presented today for surgery, with the diagnosis of newly reduced ef  The various methods of treatment have been discussed with the patient and family. After consideration of risks, benefits and other options for treatment, the patient has consented to  Procedure(s): LEFT HEART CATH AND CORONARY ANGIOGRAPHY (N/A) as a surgical intervention .  The patient's history has been reviewed, patient examined, no change in status, stable for surgery.  I have reviewed the patient's chart and labs.  Questions were answered to the patient's satisfaction.     Lance Muss

## 2018-05-17 NOTE — Progress Notes (Signed)
Progress Note  Patient Name: Melinda Mann Date of Encounter: 05/17/2018  Primary Cardiologist: Nicki Guadalajara, MD   Subjective   No chest pain or shortness of breath today.  Scheduled for cardiac catheterization later this morning  Inpatient Medications    Scheduled Meds: . enoxaparin (LOVENOX) injection  40 mg Subcutaneous Q24H  . escitalopram  10 mg Oral Daily  . ferrous sulfate  325 mg Oral Q breakfast  . folic acid  1 mg Oral Daily  . hydrochlorothiazide  25 mg Oral Daily  . irbesartan  300 mg Oral Daily  . leflunomide  10 mg Oral Daily  . meloxicam  15 mg Oral Daily  . metoprolol tartrate  50 mg Oral BID  . multivitamin with minerals  1 tablet Oral Daily  . sodium chloride flush  3 mL Intravenous Q12H  . sodium chloride flush  3 mL Intravenous Q12H  . spironolactone  12.5 mg Oral Daily   Continuous Infusions: . sodium chloride    . sodium chloride 1 mL/kg/hr (05/17/18 0629)   PRN Meds: sodium chloride, acetaminophen **OR** acetaminophen, LORazepam, sodium chloride flush   Vital Signs    Vitals:   05/16/18 2353 05/17/18 0000 05/17/18 0431 05/17/18 0844  BP: (!) 189/93 (!) 189/83 (!) 185/82 (!) 171/63  Pulse: 67 64 63 66  Resp: 18  16   Temp: 97.6 F (36.4 C)  97.6 F (36.4 C)   TempSrc: Oral  Oral   SpO2: 98%  96% 95%  Weight:   84 kg   Height:        Intake/Output Summary (Last 24 hours) at 05/17/2018 1004 Last data filed at 05/17/2018 0847 Gross per 24 hour  Intake 1787.68 ml  Output 2502 ml  Net -714.32 ml   Last 3 Weights 05/17/2018 05/16/2018 05/15/2018  Weight (lbs) 185 lb 3.2 oz 187 lb 14.4 oz 190 lb 4 oz  Weight (kg) 84.006 kg 85.231 kg 86.297 kg      Telemetry    Sinus rhythm with PVCs- Personally Reviewed  ECG    Not performed today- Personally Reviewed  Physical Exam   GEN: No acute distress.   Neck: No JVD Cardiac: RRR, no murmurs, rubs, or gallops.  Respiratory: Clear to auscultation bilaterally. GI: Soft, nontender,  non-distended  MS: No edema; No deformity. Neuro:  Nonfocal  Psych: Normal affect   Labs    Chemistry Recent Labs  Lab 05/15/18 1144  NA 134*  K 4.6  CL 102  CO2 19*  GLUCOSE 236*  BUN 13  CREATININE 1.06*  CALCIUM 9.3  PROT 6.7  ALBUMIN 3.7  AST 32  ALT 18  ALKPHOS 78  BILITOT 1.1  GFRNONAA 51*  GFRAA 59*  ANIONGAP 13     Hematology Recent Labs  Lab 05/15/18 1144  WBC 9.9  RBC 4.31  HGB 12.7  HCT 41.2  MCV 95.6  MCH 29.5  MCHC 30.8  RDW 14.8  PLT 258    Cardiac EnzymesNo results for input(s): TROPONINI in the last 168 hours.  Recent Labs  Lab 05/15/18 1158  TROPIPOC 0.02     BNPNo results for input(s): BNP, PROBNP in the last 168 hours.   DDimer No results for input(s): DDIMER in the last 168 hours.   Radiology    Dg Chest 2 View  Result Date: 05/15/2018 CLINICAL DATA:  Syncope EXAM: CHEST - 2 VIEW COMPARISON:  12/05/2017 FINDINGS: 1259 hours. The cardio pericardial silhouette is enlarged. Pulmonary vascular congestion with diffuse alveolar  opacity suggests edema. There is more focal right base atelectasis or infiltrate. The visualized bony structures of the thorax are intact. Telemetry leads overlie the chest. IMPRESSION: 1. Cardiomegaly with diffuse pulmonary edema pattern. 2. Right base atelectasis or infiltrate. Electronically Signed   By: Kennith CenterEric  Mansell M.D.   On: 05/15/2018 13:48   Dg Lumbar Spine 2-3 Views  Result Date: 05/16/2018 CLINICAL DATA:  Low back pain and right-sided sciatica after fall at home yesterday. EXAM: LUMBAR SPINE - 2-3 VIEW COMPARISON:  Lumbar spine radiographs-05/13/2018 FINDINGS: There are 5 non rib-bearing lumbar type vertebral bodies Mild scoliotic curvature the thoracolumbar spine with dominant caudal component convex the right measuring approximately 6 degrees (as measured from the superior endplate of L1 to the inferior endplate of L4). There is mild straightening expected lumbar lordosis. No anterolisthesis or  retrolisthesis. Lumbar vertebral body heights appear preserved Mild multilevel lumbar spine DDD, worse at L2-L3 and L4-L5 with disc space height loss, endplate irregularity and sclerosis. Stigmata of DISH within the lower thoracic spine Limited visualization the bilateral SI joints and hips is normal. Regional bowel gas pattern and soft tissues are normal. IMPRESSION: 1. No acute findings. 2. Mild multilevel lumbar spine DDD, worse at L2-L3 and L4-L5. Electronically Signed   By: Simonne ComeJohn  Watts M.D.   On: 05/16/2018 12:06   Ct Head Wo Contrast  Result Date: 05/15/2018 CLINICAL DATA:  Head injury after fall. EXAM: CT HEAD WITHOUT CONTRAST TECHNIQUE: Contiguous axial images were obtained from the base of the skull through the vertex without intravenous contrast. COMPARISON:  CT scan of May 07, 2013. FINDINGS: Brain: Mild diffuse cortical atrophy. 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 No mass effect or midline shift is noted. Ventricular size is within normal limits. There is no evidence of mass lesion, hemorrhage or acute infarction. Vascular: No hyperdense vessel or unexpected calcification. Skull: Normal. Negative for fracture or focal lesion. Sinuses/Orbits: Left maxillary mucous retention cyst is noted. Other: None. IMPRESSION: Mild diffuse cortical atrophy. No acute intracranial abnormality seen. Electronically Signed   By: Lupita RaiderJames  Green Jr, M.D.   On: 05/15/2018 12:45   Dg Hip Unilat With Pelvis 1v Right  Result Date: 05/16/2018 CLINICAL DATA:  Post fall, now with back and right-sided sciatic pain. EXAM: DG HIP (WITH OR WITHOUT PELVIS) 1V RIGHT COMPARISON:  05/13/2018 FINDINGS: No fracture or dislocation. Mild degenerative change of the right hip with joint space loss, subchondral sclerosis and osteophytosis. No evidence of avascular necrosis. Limited visualization of the pelvis is normal. Similar mild degenerative change of the contralateral left hip is suspected though  incompletely evaluated. Suspect degenerative change the lower lumbar spine is suspected though incompletely evaluated Punctate phleboliths overlie the lower pelvis bilaterally. Regional soft tissues appear otherwise normal. IMPRESSION: 1. No acute findings. 2. Mild degenerative change of the right hip. Electronically Signed   By: Simonne ComeJohn  Watts M.D.   On: 05/16/2018 12:08    Cardiac Studies   2D echocardiogram (05/16/2018)   - Left ventricle: The cavity size was normal. Wall thickness was   increased in a pattern of mild LVH. Systolic function was mildly   to moderately reduced. The estimated ejection fraction was in the   range of 40% to 45%. Anterior hypokinesis. Doppler parameters are   consistent with abnormal left ventricular relaxation (grade 1   diastolic dysfunction). The E&'e&' ratio is between 8-15,   suggesting indeterminate LV filling pressure. - Aortic valve: Sclerosis without stenosis. There was mild   regurgitation. -  Mitral valve: Mildly thickened leaflets . Mild to moderate   regurgitation. - Left atrium: The atrium was normal in size. - Right atrium: The atrium was mildly dilated. - Atrial septum: A patent foramen ovale cannot be excluded. - Inferior vena cava: The vessel was normal in size. The   respirophasic diameter changes were in the normal range (= 50%),   consistent with normal central venous pressure.  Impressions:  - LVEF 40-45%, mild LVH, anterior hypokinesis, grade 1 DD,   indeterminate LV filling pressure, mild to moderate MR, norma LA   size, mild RAE, cannot exclude PFO, normal IVC.  Patient Profile     Melinda Mann is a 78 y.o. female with a hx of diabetes type 2, hypertension, History of TIA and diastolic dysfunction who is being seen for the evaluation of syncope and decreased EF at the request of Dr. Gonzella Lexhungel.  Her major complaints are of increasing dyspnea on exertion of the last 3 months.  A 2D echo performed yesterday showed decline in LV  function down to 40 to 45% with an anterior wall motion abnormality.  Assessment & Plan    1: Syncope- unclear etiology.  She did have some junctional rhythm.  Recommend outpatient monitor.  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  Because of her EF decline and wall motion normalities unclear although this may be ischemically mediated and contributing to her dyspnea.  She is scheduled for cardiac catheterization today.  3: Essential hypertension- metoprolol on hold.  Blood pressure elevated.  Will need to titrate antihypertensive medications.       For questions or updates, please contact [facility] Please consult [email or web address] for contact info under        Signed, Nanetta BattyJonathan Emary Zalar, MD  05/17/2018, 10:04 AM

## 2018-05-17 NOTE — Progress Notes (Signed)
Day shift Rn Marylene Land explained discharge instructions to patient and gave her her d/c instructions prior to leaving. When asked patient denied having any additional questions or concerns. I transported downstairs to discharge. Assisted her into her friend's car safely. No other needs noted.

## 2018-05-17 NOTE — Care Management Note (Signed)
Case Management Note  Patient Details  Name: Melinda Mann MRN: [id number] Date of Birth: 1940-06-10  Subjective/Objective:    Syncope               Action/Plan: Patient lives at home with her daughter; PCP: Irena Reichmann, DO; has private insurance with BCBS with prescription drug coverage; pharmacy of choice is [facility]; patient states no problem getting her medication; DME - walker and cane at home; [facility] choice offered, pt refused all HHC services at this time; CM informed pt that if she changed her mind after discharge, her PCP can make the arrangements from his office. CM will continue to follow for progression of care.  Expected Discharge Date:    possibly 05/21/2018              Expected Discharge Plan:  Home w Home Health Services  Discharge planning Services  CM Consult  Choice offered to:  Patient HH Arranged:  Patient Refused HH  Status of Service:  In process, will continue to follow  Reola Mosher [phone] 05/17/2018, 11:04 AM

## 2018-05-17 NOTE — Discharge Summary (Signed)
Physician Discharge Summary  Melinda Mann [id number] DOB: 05/03/1941 DOA: 05/15/2018  PCP: Irena Reichmannollins, Dana, DO  Admit date: 05/15/2018 Discharge date: 05/17/2018   Admitted From: Home Disposition: Home  Recommendations for Outpatient Follow-up:  1. Follow up with PCP in 1-2 weeks. Please adjust BP meds as tolerated   Home Health: RN and PT Equipment/Devices: Has a rolling walker at home  Discharge Condition: Fair CODE STATUS: Full code Diet recommendation: Heart Healthy/carb modified    Discharge Diagnoses:  Principal Problem:   Syncope and collapse  Active Problems:   Type 2 diabetes mellitus , controlled (HCC)   History of TIA (transient ischemic attack)   Abnormal echocardiogram   Uncontrolled hypertension Vitamin D deficiency Sinus bradycardia  Brief narrative 78 year old female with type 2 diabetes mellitus, hypertension, history of TIA and diastolic dysfunction presented to the ED with multiple episodes of fall at home with unsteady gait and worsening shortness of breath.  Patient denies loss of consciousness, chest pain, shortness of breath, palpitations, dizziness or lightheadedness during these episodes. In the ED blood pressure was stable but was found to be bradycardic in the 40s. Observed for further management.  Hospital course   Principal Problem: Near syncope and collapse Suspected to be vasovagal on presentation.  Orthostasis negative. Patient also bradycardic, metoprolol dose increased by cardiologist to 75 mg twice daily (was on 50 mg twice daily) about 5 weeks back for elevated blood pressure. CT head negative.  No acute injuries on skeletal x-rays.  No EKG changes noted. Troponin in the ED negative.  Heart rate improved on the monitor after holding beta-blocker.  Abnormal 2D echo 2D echo repeated (last done 2 months back) showing worsened EF of 40-45% with anterior hypokinesis.  These changes are new as echo 2 months back showed normal EF and no  wall motion abnormality. Given concern for ischemia she had left heart catheterization which showed no significant coronary artery disease with low normal LV function of 50-55%. Cardiology recommends medical management and outpatient follow-up with Dr. Tresa EndoKelly.  Active Problems: Uncontrolled hypertension Blood pressure elevated.  Continue Avapro.  Metoprolol resumed at 50 mg twice daily.  Continue Lasix and Aldactone.  Increased dose of HCTZ.    History of TIA (transient ischemic attack) Not on any meds.  Right hip pain/sciatica X-ray of the hip unremarkable.  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  PT recommends home health.  Pain control with Tylenol and daily meloxicam. Also has low vitamin D level (19.4).  Will add supplement.   Diabetes mellitus type 2 , controlled Not on home meds.    CBG control.  Follow-up with PCP as outpatient.    Family Communication  : None at bedside  Disposition Plan  : Home    Consults  :  cardiology  Procedures  : 2D echo, head CT, left heart catheterization   Discharge Instructions   Allergies as of 05/17/2018   No Known Allergies     Medication List    TAKE these medications   escitalopram 10 MG tablet Commonly known as:  LEXAPRO Take 10 mg by mouth daily.   fluticasone 50 MCG/ACT nasal spray Commonly known as:  FLONASE 1 to 2 sprays each nostril as needed   folic acid 1 MG tablet Commonly known as:  FOLVITE Take 1 mg by mouth daily.   GARLIC PO Take 2 capsules by mouth daily.   hydrochlorothiazide 12.5 MG tablet Commonly known as:  HYDRODIURIL Take 2 tablets (25 mg total) by mouth daily.  What changed:  how much to take   Iron Tabs Take 65 mg by mouth daily.   leflunomide 10 MG tablet Commonly known as:  ARAVA Take 10 mg by mouth daily.   LORazepam 1 MG tablet Commonly known as:  ATIVAN Take 1 mg by mouth 2 (two) times daily as needed for anxiety.   meloxicam 15 MG tablet Commonly known as:   MOBIC Take 15 mg by mouth daily.   metoprolol tartrate 50 MG tablet Commonly known as:  LOPRESSOR Take 0.5 tablets (25 mg total) by mouth 2 (two) times daily for 30 days. What changed:    medication strength  how much to take   multivitamin with minerals Tabs tablet Take 1 tablet by mouth daily.   spironolactone 25 MG tablet Commonly known as:  ALDACTONE Take 0.5 tablets (12.5 mg total) by mouth daily.   valsartan 320 MG tablet Commonly known as:  DIOVAN Take 320 mg by mouth daily.   Vitamin D (Ergocalciferol) 1.25 MG (50000 UT) Caps capsule Commonly known as:  DRISDOL Take 1 capsule (50,000 Units total) by mouth every 7 (seven) days.      Follow-up Information    Irena Reichmannollins, Dana, DO. Schedule an appointment as soon as possible for a visit in 1 week(s).   Specialty:  Family Medicine Contact information: [address] [place] [address] [phone]        Lennette BihariKelly, Thomas A, MD .   Specialty:  Cardiology Contact information: [address] [place] [address] [phone]          No Known Allergies    Procedures/Studies: Dg Chest 2 View  Result Date: 05/15/2018 CLINICAL DATA:  Syncope EXAM: CHEST - 2 VIEW COMPARISON:  12/05/2017 FINDINGS: 1259 hours. The cardio pericardial silhouette is enlarged. Pulmonary vascular congestion with diffuse alveolar opacity suggests edema. There is more focal right base atelectasis or infiltrate. The visualized bony structures of the thorax are intact. Telemetry leads overlie the chest. IMPRESSION: 1. Cardiomegaly with diffuse pulmonary edema pattern. 2. Right base atelectasis or infiltrate. Electronically Signed   By: Kennith CenterEric  Mansell M.D.   On: 05/15/2018 13:48   Dg Lumbar Spine 2-3 Views  Result Date: 05/16/2018 CLINICAL DATA:  Low back pain and right-sided sciatica after fall at home yesterday. EXAM: LUMBAR SPINE - 2-3 VIEW COMPARISON:  Lumbar spine radiographs-05/13/2018 FINDINGS: There are 5  non rib-bearing lumbar type vertebral bodies Mild scoliotic curvature the thoracolumbar spine with dominant caudal component convex the right measuring approximately 6 degrees (as measured from the superior endplate of L1 to the inferior endplate of L4). There is mild straightening expected lumbar lordosis. No anterolisthesis or retrolisthesis. Lumbar vertebral body heights appear preserved Mild multilevel lumbar spine DDD, worse at L2-L3 and L4-L5 with disc space height loss, endplate irregularity and sclerosis. Stigmata of DISH within the lower thoracic spine Limited visualization the bilateral SI joints and hips is normal. Regional bowel gas pattern and soft tissues are normal. IMPRESSION: 1. No acute findings. 2. Mild multilevel lumbar spine DDD, worse at L2-L3 and L4-L5. Electronically Signed   By: Simonne ComeJohn  Watts M.D.   On: 05/16/2018 12:06   Ct Head Wo Contrast  Result Date: 05/15/2018 CLINICAL DATA:  Head injury after fall. EXAM: CT HEAD WITHOUT CONTRAST TECHNIQUE: Contiguous axial images were obtained from the base of the skull through the vertex without intravenous contrast. COMPARISON:  CT scan of May 07, 2013. FINDINGS: Brain: Mild diffuse cortical atrophy. 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 No mass effect or midline shift is noted. Ventricular size is within normal limits. There is no evidence of mass lesion, hemorrhage or acute infarction. Vascular: No hyperdense vessel or unexpected calcification. Skull: Normal. Negative for fracture or focal lesion. Sinuses/Orbits: Left maxillary mucous retention cyst is noted. Other: None. IMPRESSION: Mild diffuse cortical atrophy. No acute intracranial abnormality seen. Electronically Signed   By: Lupita Raider, M.D.   On: 05/15/2018 12:45   Dg Hip Unilat With Pelvis 1v Right  Result Date: 05/16/2018 CLINICAL DATA:  Post fall, now with back and right-sided sciatic pain. EXAM: DG HIP (WITH OR WITHOUT PELVIS) 1V RIGHT COMPARISON:   05/13/2018 FINDINGS: No fracture or dislocation. Mild degenerative change of the right hip with joint space loss, subchondral sclerosis and osteophytosis. No evidence of avascular necrosis. Limited visualization of the pelvis is normal. Similar mild degenerative change of the contralateral left hip is suspected though incompletely evaluated. Suspect degenerative change the lower lumbar spine is suspected though incompletely evaluated Punctate phleboliths overlie the lower pelvis bilaterally. Regional soft tissues appear otherwise normal. IMPRESSION: 1. No acute findings. 2. Mild degenerative change of the right hip. Electronically Signed   By: Simonne Come M.D.   On: 05/16/2018 12:08       Subjective: Seen after returning from cardiac cath.  Denies any chest pain.  Reports mild shortness of breath.  Discharge Exam: Vitals:   05/17/18 1443 05/17/18 1445  BP: (!) 170/70 (!) 180/87  Pulse: 63 64  Resp:    Temp:    SpO2:     Vitals:   05/17/18 1430 05/17/18 1440 05/17/18 1443 05/17/18 1445  BP: (!) 169/71 (!) 174/72 (!) 170/70 (!) 180/87  Pulse: 61 60 63 64  Resp:      Temp:      TempSrc:      SpO2: 100%     Weight:      Height:        General: Elderly female not in distress HEENT: Moist mucosa, supple neck Chest: Clear bilaterally CVs: Normal S1-S2, no murmurs GI: Soft, nondistended, nontender Musculoskeletal: Warm, no edema, right radial cath site clean     The results of significant diagnostics from this hospitalization (including imaging, microbiology, ancillary and laboratory) are listed below for reference.     Microbiology: No results found for this or any previous visit (from the past 240 hour(s)).   Labs: BNP (last 3 results) Recent Labs    03/14/18 1028  BNP 246.1*   Basic Metabolic Panel: Recent Labs  Lab 05/15/18 1144  NA 134*  K 4.6  CL 102  CO2 19*  GLUCOSE 236*  BUN 13  CREATININE 1.06*  CALCIUM 9.3   Liver Function Tests: Recent Labs  Lab  05/15/18 1144  AST 32  ALT 18  ALKPHOS 78  BILITOT 1.1  PROT 6.7  ALBUMIN 3.7   No results for input(s): LIPASE, AMYLASE in the last 168 hours. No results for input(s): AMMONIA in the last 168 hours. CBC: Recent Labs  Lab 05/15/18 1144  WBC 9.9  NEUTROABS 8.1*  HGB 12.7  HCT 41.2  MCV 95.6  PLT 258   Cardiac Enzymes: No results for input(s): CKTOTAL, CKMB, CKMBINDEX, TROPONINI in the last 168 hours. BNP: Invalid input(s): POCBNP CBG: Recent Labs  Lab 05/15/18 1132 05/17/18 0841  GLUCAP 236* 120*   D-Dimer No results for input(s): DDIMER in the last 72 hours. Hgb A1c No results for input(s): HGBA1C in the last  72 hours. Lipid Profile No results for input(s): CHOL, HDL, LDLCALC, TRIG, CHOLHDL, LDLDIRECT in the last 72 hours. Thyroid function studies No results for input(s): TSH, T4TOTAL, T3FREE, THYROIDAB in the last 72 hours.  Invalid input(s): FREET3 Anemia work up No results for input(s): VITAMINB12, FOLATE, FERRITIN, TIBC, IRON, RETICCTPCT in the last 72 hours. Urinalysis    Component Value Date/Time   COLORURINE YELLOW 05/15/2018 1458   APPEARANCEUR CLEAR 05/15/2018 1458   LABSPEC 1.009 05/15/2018 1458   PHURINE 6.0 05/15/2018 1458   GLUCOSEU NEGATIVE 05/15/2018 1458   HGBUR SMALL (A) 05/15/2018 1458   BILIRUBINUR NEGATIVE 05/15/2018 1458   KETONESUR NEGATIVE 05/15/2018 1458   PROTEINUR NEGATIVE 05/15/2018 1458   UROBILINOGEN 1.0 06/13/2013 2106   NITRITE NEGATIVE 05/15/2018 1458   LEUKOCYTESUR NEGATIVE 05/15/2018 1458   Sepsis Labs Invalid input(s): PROCALCITONIN,  WBC,  LACTICIDVEN Microbiology No results found for this or any previous visit (from the past 240 hour(s)).   Time coordinating discharge: <30 minutes  SIGNED:   Eddie North, MD  [facility] 05/17/2018, 2:55 PM Pager   If 7PM-7AM, please contact night-coverage [email or web address] Password [email or web address]

## 2018-05-17 NOTE — Progress Notes (Signed)
Cath lab called to inform pt needs to be ready for cath, consent signed,pt has npo except sips for with meds, pt has voided and informed of pending cath pickup, ivf as ordered, pt denies needing to call anybody

## 2018-05-23 ENCOUNTER — Ambulatory Visit: Payer: Medicare Other

## 2018-05-23 NOTE — Progress Notes (Deleted)
Patient ID: Melinda Mann                 DOB: 10/09/1940                      MRN: [id number]     HPI: Melinda Mann is a 78 y.o. female referred by Dr. Tresa Endo to HTN clinic.PMH includes hypertension, DM-II, history of TIA, and bradycardia.   Current HTN meds:  HCTZ 25mg  daily Metoprolol 12.5mg  twice daily Spironolactone 12.5mg  daily Valsartan 320mg  daily  BP goal: 130/80  Family History:   Social History:   Diet:   Exercise:   Home BP readings:   Wt Readings from Last 3 Encounters:  05/17/18 185 lb 3.2 oz (84 kg)  04/04/18 194 lb 12.8 oz (88.4 kg)  02/28/18 195 lb 12.8 oz (88.8 kg)   BP Readings from Last 3 Encounters:  05/17/18 (!) 179/86  04/04/18 (!) 176/84  02/28/18 140/80   Pulse Readings from Last 3 Encounters:  05/17/18 78  04/04/18 91  02/28/18 98    Renal function: Estimated Creatinine Clearance: 50.1 mL/min (A) (by C-G formula based on SCr of 1.06 mg/dL (H)).  Past Medical History:  Diagnosis Date  . Diabetes mellitus without complication (HCC)   . Hypertension   . TIA (transient ischemic attack)     Current Outpatient Medications on File Prior to Visit  Medication Sig Dispense Refill  . escitalopram (LEXAPRO) 10 MG tablet Take 10 mg by mouth daily.    . fluticasone (FLONASE) 50 MCG/ACT nasal spray 1 to 2 sprays each nostril as needed    . folic acid (FOLVITE) 1 MG tablet Take 1 mg by mouth daily.  1  . GARLIC PO Take 2 capsules by mouth daily.    . hydrochlorothiazide (HYDRODIURIL) 12.5 MG tablet Take 2 tablets (25 mg total) by mouth daily. 30 tablet 2  . Iron TABS Take 65 mg by mouth daily.    Marland Kitchen leflunomide (ARAVA) 10 MG tablet Take 10 mg by mouth daily.    Marland Kitchen LORazepam (ATIVAN) 1 MG tablet Take 1 mg by mouth 2 (two) times daily as needed for anxiety.     . meloxicam (MOBIC) 15 MG tablet Take 15 mg by mouth daily.    . metoprolol tartrate (LOPRESSOR) 50 MG tablet Take 0.5 tablets (25 mg total) by mouth 2 (two) times daily for 30 days. 60 tablet  0  . Multiple Vitamin (MULTIVITAMIN WITH MINERALS) TABS tablet Take 1 tablet by mouth daily.    Marland Kitchen spironolactone (ALDACTONE) 25 MG tablet Take 0.5 tablets (12.5 mg total) by mouth daily. 45 tablet 3  . valsartan (DIOVAN) 320 MG tablet Take 320 mg by mouth daily.  0  . Vitamin D, Ergocalciferol, (DRISDOL) 1.25 MG (50000 UT) CAPS capsule Take 1 capsule (50,000 Units total) by mouth every 7 (seven) days. 8 capsule 0   No current facility-administered medications on file prior to visit.     No Known Allergies  There were no vitals taken for this visit.  No problem-specific Assessment & Plan notes found for this encounter.   Tomer Chalmers Rodriguez-Guzman PharmD, BCPS, CPP [facility] [address] [place] [address] 05/23/2018 12:55 PM

## 2018-06-10 ENCOUNTER — Emergency Department (HOSPITAL_COMMUNITY): Payer: Medicare Other

## 2018-06-10 ENCOUNTER — Observation Stay (HOSPITAL_COMMUNITY)
Admission: EM | Admit: 2018-06-10 | Discharge: 2018-06-12 | Disposition: A | Discharge status: Home / Self Care | Payer: Medicare Other | Attending: Internal Medicine | Admitting: Internal Medicine

## 2018-06-10 ENCOUNTER — Other Ambulatory Visit: Payer: Self-pay

## 2018-06-10 ENCOUNTER — Encounter (HOSPITAL_COMMUNITY): Payer: Self-pay | Admitting: Emergency Medicine

## 2018-06-10 DIAGNOSIS — Z8673 Personal history of transient ischemic attack (TIA), and cerebral infarction without residual deficits: Secondary | ICD-10-CM

## 2018-06-10 DIAGNOSIS — F418 Other specified anxiety disorders: Secondary | ICD-10-CM | POA: Diagnosis not present

## 2018-06-10 DIAGNOSIS — Z8249 Family history of ischemic heart disease and other diseases of the circulatory system: Secondary | ICD-10-CM | POA: Diagnosis not present

## 2018-06-10 DIAGNOSIS — R7989 Other specified abnormal findings of blood chemistry: Secondary | ICD-10-CM | POA: Diagnosis present

## 2018-06-10 DIAGNOSIS — Z791 Long term (current) use of non-steroidal anti-inflammatories (NSAID): Secondary | ICD-10-CM | POA: Insufficient documentation

## 2018-06-10 DIAGNOSIS — I110 Hypertensive heart disease with heart failure: Principal | ICD-10-CM | POA: Insufficient documentation

## 2018-06-10 DIAGNOSIS — I10 Essential (primary) hypertension: Secondary | ICD-10-CM | POA: Diagnosis present

## 2018-06-10 DIAGNOSIS — I509 Heart failure, unspecified: Secondary | ICD-10-CM

## 2018-06-10 DIAGNOSIS — I2510 Atherosclerotic heart disease of native coronary artery without angina pectoris: Secondary | ICD-10-CM | POA: Diagnosis not present

## 2018-06-10 DIAGNOSIS — Z79899 Other long term (current) drug therapy: Secondary | ICD-10-CM | POA: Insufficient documentation

## 2018-06-10 DIAGNOSIS — I700 Atherosclerosis of aorta: Secondary | ICD-10-CM | POA: Diagnosis not present

## 2018-06-10 DIAGNOSIS — K449 Diaphragmatic hernia without obstruction or gangrene: Secondary | ICD-10-CM | POA: Insufficient documentation

## 2018-06-10 DIAGNOSIS — J9811 Atelectasis: Secondary | ICD-10-CM | POA: Insufficient documentation

## 2018-06-10 DIAGNOSIS — S2241XA Multiple fractures of ribs, right side, initial encounter for closed fracture: Secondary | ICD-10-CM | POA: Diagnosis not present

## 2018-06-10 DIAGNOSIS — X58XXXA Exposure to other specified factors, initial encounter: Secondary | ICD-10-CM | POA: Insufficient documentation

## 2018-06-10 DIAGNOSIS — E119 Type 2 diabetes mellitus without complications: Secondary | ICD-10-CM | POA: Insufficient documentation

## 2018-06-10 DIAGNOSIS — I5043 Acute on chronic combined systolic (congestive) and diastolic (congestive) heart failure: Secondary | ICD-10-CM | POA: Diagnosis not present

## 2018-06-10 DIAGNOSIS — M47814 Spondylosis without myelopathy or radiculopathy, thoracic region: Secondary | ICD-10-CM | POA: Insufficient documentation

## 2018-06-10 DIAGNOSIS — M8580 Other specified disorders of bone density and structure, unspecified site: Secondary | ICD-10-CM | POA: Diagnosis not present

## 2018-06-10 DIAGNOSIS — J9601 Acute respiratory failure with hypoxia: Secondary | ICD-10-CM | POA: Diagnosis present

## 2018-06-10 DIAGNOSIS — J90 Pleural effusion, not elsewhere classified: Secondary | ICD-10-CM | POA: Insufficient documentation

## 2018-06-10 DIAGNOSIS — Z7951 Long term (current) use of inhaled steroids: Secondary | ICD-10-CM | POA: Insufficient documentation

## 2018-06-10 DIAGNOSIS — E876 Hypokalemia: Secondary | ICD-10-CM | POA: Diagnosis present

## 2018-06-10 LAB — COMPREHENSIVE METABOLIC PANEL
ALT: 19 U/L (ref 0–44)
AST: 22 U/L (ref 15–41)
Albumin: 3.7 g/dL (ref 3.5–5.0)
Alkaline Phosphatase: 80 U/L (ref 38–126)
Anion gap: 13 (ref 5–15)
BUN: 18 mg/dL (ref 8–23)
CO2: 22 mmol/L (ref 22–32)
Calcium: 9.4 mg/dL (ref 8.9–10.3)
Chloride: 105 mmol/L (ref 98–111)
Creatinine, Ser: 1.11 mg/dL — ABNORMAL HIGH (ref 0.44–1.00)
GFR calc Af Amer: 56 mL/min — ABNORMAL LOW (ref >60)
GFR calc non Af Amer: 49 mL/min — ABNORMAL LOW (ref >60)
Glucose, Bld: 150 mg/dL — ABNORMAL HIGH (ref 70–99)
Potassium: 4.3 mmol/L (ref 3.5–5.1)
Sodium: 140 mmol/L (ref 135–145)
Total Bilirubin: 0.4 mg/dL (ref 0.3–1.2)
Total Protein: 7 g/dL (ref 6.5–8.1)

## 2018-06-10 LAB — CBC WITH DIFFERENTIAL/PLATELET
Abs Immature Granulocytes: 0.16 10*3/uL — ABNORMAL HIGH (ref 0.00–0.07)
Basophils Absolute: 0.1 10*3/uL (ref 0.0–0.1)
Basophils Relative: 1 %
Eosinophils Absolute: 0.2 10*3/uL (ref 0.0–0.5)
Eosinophils Relative: 2 %
HCT: 39.5 % (ref 36.0–46.0)
Hemoglobin: 11.9 g/dL — ABNORMAL LOW (ref 12.0–15.0)
Immature Granulocytes: 2 %
Lymphocytes Relative: 11 %
Lymphs Abs: 1.2 10*3/uL (ref 0.7–4.0)
MCH: 28.8 pg (ref 26.0–34.0)
MCHC: 30.1 g/dL (ref 30.0–36.0)
MCV: 95.6 fL (ref 80.0–100.0)
Monocytes Absolute: 0.4 10*3/uL (ref 0.1–1.0)
Monocytes Relative: 3 %
Neutro Abs: 8.8 10*3/uL — ABNORMAL HIGH (ref 1.7–7.7)
Neutrophils Relative %: 81 %
Platelets: 243 10*3/uL (ref 150–400)
RBC: 4.13 MIL/uL (ref 3.87–5.11)
RDW: 14.7 % (ref 11.5–15.5)
WBC: 10.8 10*3/uL — AB (ref 4.0–10.5)
nRBC: 0 % (ref 0.0–0.2)

## 2018-06-10 LAB — I-STAT TROPONIN, ED: Troponin i, poc: 0.02 ng/mL (ref 0.00–0.08)

## 2018-06-10 LAB — BRAIN NATRIURETIC PEPTIDE: B Natriuretic Peptide: 714.7 pg/mL — ABNORMAL HIGH (ref 0.0–100.0)

## 2018-06-10 LAB — CBG MONITORING, ED: Glucose-Capillary: 132 mg/dL — ABNORMAL HIGH (ref 70–99)

## 2018-06-10 LAB — D-DIMER, QUANTITATIVE (NOT AT ARMC): D DIMER QUANT: 3.63 ug{FEU}/mL — AB (ref 0.00–0.50)

## 2018-06-10 MED ORDER — FUROSEMIDE 10 MG/ML IJ SOLN: 20.0000 mg | Freq: Once | INTRAMUSCULAR | Status: DC

## 2018-06-10 MED ORDER — IOPAMIDOL (ISOVUE-370) INJECTION 76%
75.0000 mL | Freq: Once | INTRAVENOUS | Status: AC | PRN
  Administered 2018-06-10: 75 mL via INTRAVENOUS

## 2018-06-10 MED ORDER — DM-GUAIFENESIN ER 30-600 MG PO TB12: 1.0000 | ORAL_TABLET | Freq: Two times a day (BID) | ORAL | Status: DC | PRN

## 2018-06-10 MED ORDER — FUROSEMIDE 10 MG/ML IJ SOLN
40.0000 mg | Freq: Two times a day (BID) | INTRAMUSCULAR | Status: DC
  Administered 2018-06-11 (×2): 40 mg via INTRAVENOUS
  Filled 2018-06-10 (×2): qty 4

## 2018-06-10 MED ORDER — IOPAMIDOL (ISOVUE-370) INJECTION 76%
INTRAVENOUS | Status: AC
  Filled 2018-06-10: qty 100

## 2018-06-10 MED ORDER — ALBUTEROL SULFATE (2.5 MG/3ML) 0.083% IN NEBU: 2.5000 mg | INHALATION_SOLUTION | RESPIRATORY_TRACT | Status: DC | PRN

## 2018-06-10 NOTE — H&P (Signed)
History and Physical    Melinda Mann ZOX:[id number] DOB: 01/09/1941 DOA: 06/10/2018  Referring MD/NP/PA:   PCP: Irena Reichmann, DO   Patient coming from:  The patient is coming from home.  At baseline, pt is independent for most of ADL.        Chief Complaint: Shortness of breath  HPI: Melinda Mann is a 78 y.o. female with medical history significant of hypertension, diabetes mellitus, TIA, depression, anxiety, CHF with EF of 40%, sciatica, who presents with shortness of breath.  Patient states that she has been having shortness breath for almost 6 weeks, which has worsened in the past several days.  She has dry cough, but no chest pain, fever or chills.  She was found to have oxygen desaturation to 80%.  Patient states that she had one loose stool diarrhea recently, currently no nausea, vomiting, diarrhea or abdominal pain.  No symptoms of UTI.  No unilateral weakness.  ED Course: pt was found to have BNP 717, positive d-dimer 3.63, negative troponin, WBC 10.8, creatinine 1.11, BUN 18, temperature normal, bradycardia, chest x-ray showed cardiomegaly.  CT angiogram is negative for PE, but showed pulmonary edema and bilateral pleural effusion, also showed acute to subacute right lateral mildly displaced 5 and 6 rib fractures. Pt is placed on telemetry bed for observation.  Review of Systems:   General: no fevers, chills, no body weight gain, has fatigue HEENT: no blurry vision, hearing changes or sore throat Respiratory: has dyspnea, coughing, no wheezing CV: no chest pain, no palpitations GI: no nausea, vomiting, abdominal pain, diarrhea, constipation GU: no dysuria, burning on urination, increased urinary frequency, hematuria  Ext: has mild leg edema Neuro: no unilateral weakness, numbness, or tingling, no vision change or hearing loss Skin: no rash, no skin tear. MSK: No muscle spasm, no deformity, no limitation of range of movement in spin Heme: No easy bruising.  Travel history: No  recent long distant travel.  Allergy: No Known Allergies  Past Medical History:  Diagnosis Date  . Diabetes mellitus without complication (HCC)   . Hypertension   . TIA (transient ischemic attack)     Past Surgical History:  Procedure Laterality Date  . LEFT HEART CATH AND CORONARY ANGIOGRAPHY N/A 05/17/2018   Procedure: LEFT HEART CATH AND CORONARY ANGIOGRAPHY;  Surgeon: Corky Crafts, MD;  Location: [facility] INVASIVE CV LAB;  Service: Cardiovascular;  Laterality: N/A;    Social History:  reports that she has never smoked. She has never used smokeless tobacco. She reports that she does not drink alcohol or use drugs.  Family History:  Family History  Problem Relation Age of Onset  . Colon cancer Brother   . Colon cancer Brother   . Lung cancer Sister        never smoked  . Heart disease Mother   . Breast cancer Mother   . Heart attack Sister   . Heart disease Brother   . Breast cancer Sister      Prior to Admission medications   Medication Sig Start Date End Date Taking? Authorizing Provider  escitalopram (LEXAPRO) 10 MG tablet Take 10 mg by mouth daily.    [provider]  fluticasone (FLONASE) 50 MCG/ACT nasal spray 1 to 2 sprays each nostril as needed 07/10/13   [provider]  folic acid (FOLVITE) 1 MG tablet Take 1 mg by mouth daily. 02/01/18   [provider]  GARLIC PO Take 2 capsules by mouth daily.    [provider]  hydrochlorothiazide (HYDRODIURIL) 12.5 MG tablet Take 2 tablets (25 mg total) by mouth daily. 05/17/18   Dhungel, Theda Belfast, MD  Iron TABS Take 65 mg by mouth daily.    [provider]  leflunomide (ARAVA) 10 MG tablet Take 10 mg by mouth daily.    [provider]  LORazepam (ATIVAN) 1 MG tablet Take 1 mg by mouth 2 (two) times daily as needed for anxiety.     [provider]  meloxicam (MOBIC) 15 MG tablet Take 15 mg by mouth daily.    [provider]  metoprolol tartrate  (LOPRESSOR) 50 MG tablet Take 0.5 tablets (25 mg total) by mouth 2 (two) times daily for 30 days. 05/17/18 06/16/18  Dhungel, Theda Belfast, MD  Multiple Vitamin (MULTIVITAMIN WITH MINERALS) TABS tablet Take 1 tablet by mouth daily.    [provider]  spironolactone (ALDACTONE) 25 MG tablet Take 0.5 tablets (12.5 mg total) by mouth daily. 04/04/18 07/03/18  Lennette Bihari, MD  valsartan (DIOVAN) 320 MG tablet Take 320 mg by mouth daily. 02/01/18   [provider]  Vitamin D, Ergocalciferol, (DRISDOL) 1.25 MG (50000 UT) CAPS capsule Take 1 capsule (50,000 Units total) by mouth every 7 (seven) days. 05/17/18   Eddie North, MD    Physical Exam: Vitals:   06/11/18 0008 06/11/18 0039 06/11/18 0102 06/11/18 0452  BP:  (!) 156/91  (!) 156/66  Pulse:  85 (!) 53 (!) 56  Resp:    20  Temp:  (!) 97.4 F (36.3 C)  (!) 97.5 F (36.4 C)  TempSrc:  Oral  Oral  SpO2:  99%  94%  Weight: 84.1 kg     Height: 5\' 6"  (1.676 m)      General: Not in acute distress HEENT:       Eyes: PERRL, EOMI, no scleral icterus.       ENT: No discharge from the ears and nose, no pharynx injection, no tonsillar enlargement.        Neck: No JVD, no bruit, no mass felt. Heme: No neck lymph node enlargement. Cardiac: S1/S2, RRR, No murmurs, No gallops or rubs. Respiratory:  No rales, wheezing, rhonchi or rubs. GI: Soft, nondistended, nontender, no rebound pain, no organomegaly, BS present. GU: No hematuria Ext: has trace leg edema bilaterally. 2+DP/PT pulse bilaterally. Musculoskeletal: No joint deformities, No joint redness or warmth, no limitation of ROM in spin. Skin: No rashes.  Neuro: Alert, oriented X3, cranial nerves II-XII grossly intact, moves all extremities normally.  Psych: Patient is not psychotic, no suicidal or hemocidal ideation.  Labs on Admission: I have personally reviewed following labs and imaging studies  CBC: Recent Labs  Lab 06/10/18 2000  WBC 10.8*  NEUTROABS 8.8*  HGB 11.9*    HCT 39.5  MCV 95.6  PLT 243   Basic Metabolic Panel: Recent Labs  Lab 06/10/18 2000  NA 140  K 4.3  CL 105  CO2 22  GLUCOSE 150*  BUN 18  CREATININE 1.11*  CALCIUM 9.4   GFR: Estimated Creatinine Clearance: 47.8 mL/min (A) (by C-G formula based on SCr of 1.11 mg/dL (H)). Liver Function Tests: Recent Labs  Lab 06/10/18 2000  AST 22  ALT 19  ALKPHOS 80  BILITOT 0.4  PROT 7.0  ALBUMIN 3.7   No results for input(s): LIPASE, AMYLASE in the last 168 hours. No results for input(s): AMMONIA in the last 168 hours. Coagulation Profile: No results for input(s): INR, PROTIME in the last 168 hours. Cardiac  Enzymes: No results for input(s): CKTOTAL, CKMB, CKMBINDEX, TROPONINI in the last 168 hours. BNP (last 3 results) No results for input(s): PROBNP in the last 8760 hours. HbA1C: No results for input(s): HGBA1C in the last 72 hours. CBG: Recent Labs  Lab 06/10/18 2038 06/11/18 0101  GLUCAP 132* 106*   Lipid Profile: No results for input(s): CHOL, HDL, LDLCALC, TRIG, CHOLHDL, LDLDIRECT in the last 72 hours. Thyroid Function Tests: No results for input(s): TSH, T4TOTAL, FREET4, T3FREE, THYROIDAB in the last 72 hours. Anemia Panel: No results for input(s): VITAMINB12, FOLATE, FERRITIN, TIBC, IRON, RETICCTPCT in the last 72 hours. Urine analysis:    Component Value Date/Time   COLORURINE YELLOW 05/15/2018 1458   APPEARANCEUR CLEAR 05/15/2018 1458   LABSPEC 1.009 05/15/2018 1458   PHURINE 6.0 05/15/2018 1458   GLUCOSEU NEGATIVE 05/15/2018 1458   HGBUR SMALL (A) 05/15/2018 1458   BILIRUBINUR NEGATIVE 05/15/2018 1458   KETONESUR NEGATIVE 05/15/2018 1458   PROTEINUR NEGATIVE 05/15/2018 1458   UROBILINOGEN 1.0 06/13/2013 2106   NITRITE NEGATIVE 05/15/2018 1458   LEUKOCYTESUR NEGATIVE 05/15/2018 1458   Sepsis Labs: @LABRCNTIP (procalcitonin:4,lacticidven:4) )No results found for this or any previous visit (from the past 240 hour(s)).   Radiological Exams on  Admission: Ct Angio Chest Pe W And/or Wo Contrast  Result Date: 06/10/2018 CLINICAL DATA:  78 y/o F; 78 y/o F; 6 weeks of shortness of breath. EXAM: CT ANGIOGRAPHY CHEST WITH CONTRAST TECHNIQUE: Multidetector CT imaging of the chest was performed using the standard protocol during bolus administration of intravenous contrast. Multiplanar CT image reconstructions and MIPs were obtained to evaluate the vascular anatomy. CONTRAST:  63 cc Isovue 370 COMPARISON:  07/23/2014 CT chest. FINDINGS: Cardiovascular: Mild aortic and coronary artery calcific atherosclerosis. Normal caliber aorta and main pulmonary artery. Mild cardiomegaly. No pericardial effusion. Satisfactory opacification of the pulmonary arteries. No pulmonary embolus. Mediastinum/Nodes: No enlarged mediastinal, hilar, or axillary lymph nodes. Thyroid gland, trachea, and esophagus demonstrate no significant findings. Lungs/Pleura: Smooth interlobular septal thickening, central ground-glass opacities, peribronchial thickening, moderate right pleural effusion, small left pleural effusion. No consolidation or pneumothorax. Upper Abdomen: Partially visualized exophytic cyst of the right kidney measuring up to 3.8 cm. Small hiatal hernia. Musculoskeletal: Right 5-6 lateral rib mildly displaced fractures. Review of the MIP images confirms the above findings. IMPRESSION: 1. Interstitial and mild alveolar pulmonary edema. Moderate right and small left pleural effusions. 2. Mild cardiomegaly. 3. Mild coronary artery calcific atherosclerosis and Aortic Atherosclerosis (ICD10-I70.0). 4. Acute to subacute right lateral mildly displaced 5 and 6 rib fractures. Electronically Signed   By: Mitzi Hansen M.D.   On: 06/10/2018 22:34   Dg Chest Portable 1 View  Result Date: 06/10/2018 CLINICAL DATA:  Shortness of breath. EXAM: PORTABLE CHEST 1 VIEW COMPARISON:  05/15/2018. FINDINGS: Stable mild enlargement of the cardiac silhouette. Small amount of airspace  opacity at the right lateral lung base with improvement. Probable minimal right pleural effusion with improvement. Mildly prominent interstitial markings with improvement. Diffuse osteopenia, thoracic spine degenerative changes and bilateral shoulder degenerative changes. IMPRESSION: 1. Improving changes of congestive heart failure with stable cardiomegaly. 2. Improved right basilar atelectasis, alveolar edema or pneumonia with minimal residual laterally. Electronically Signed   By: Beckie Salts M.D.   On: 06/10/2018 20:29     EKG: Independently reviewed.  Sinus rhythm, QTC 463, RAD, anteroseptal infarction pattern  Assessment/Plan Principal Problem:   Acute on chronic combined systolic and diastolic CHF (congestive heart failure) (HCC) Active Problems:   History of TIA (transient ischemic  attack)   Acute respiratory failure with hypoxia (HCC)   Diabetes mellitus without complication (HCC)   Depression with anxiety   HTN (hypertension)   Acute respiratory failure with hypoxia due to acute on chronic combined systolic and diastolic CHF (congestive heart failure) (HCC): Patient has elevated BNP, chest x-ray showed cardiomegaly, indicating possible CHF exacerbation.  D-dimer positive, but CT angiogram is negative for PE.  2D echo 05/16/2018 showed EF of 40- 45% with grade 1 diastolic dysfunction.  Will not repeat a 2D echo today.  -will place on tele bed for obs -Lasix 40 mg bid by IV -Daily weights -strict I/O's -Low salt diet - LE venous Doppler to rule out  DVT due to positive d-dimer.  History of TIA (transient ischemic attack): -ASA  Diabetes mellitus without complication (HCC): Last A1c no on record.  Patient is taking metformin at home -SSI  Depression with anxiety: no SI or HI. -Continue home medications: Lexapro and Ativan  HTN:  -Continue home medications: Metoprolol and diovan -hold HCTZ since pt is IV lasix -IV hydralazine prn   DVT ppx: SQ Lovenox Code Status: Full  code Family Communication: None at bed side.  Disposition Plan:  Anticipate discharge back to previous home environment Consults called: None Admission status: Obs / tele     Date of Service 06/11/2018    Lorretta HarpXilin Sewell Pitner [facility]   If 7PM-7AM, please contact night-coverage [email or web address] Password [email or web address] 06/11/2018, 6:58 AM

## 2018-06-10 NOTE — ED Provider Notes (Signed)
[facility] EMERGENCY DEPARTMENT Provider Note   CSN: [id number] Arrival date & time: 06/10/18  1926     History   Chief Complaint Chief Complaint  Patient presents with  . Cough  . Shortness of Breath    HPI Melinda Mann is a 78 y.o. female.  HPI   RYLIEGH SEVCIK is a 78 y.o. female with PMH of diabetes, hypertension, TIA who presents with dyspnea for about 6 weeks that had worsened significantly this evening.  She states that she has had a dry cough which is largely nonproductive but does feel that she has congestion that comes up toward her throat.  She is not able to produce the sputum.  No fevers or chills.  Dyspnea has not been significantly worse with laying flat for the last several weeks.  No chest pain or pressure.  Reports recent work-up for her dyspnea without clear etiology.  No known history of DVT or PE and no history of primary lung disease.  This evening, was driving to get food for her daughter when the car seem to drive strange.  She then parked the car at the bottom of the driveway and started to walk up toward the house.  At that time had worsening dyspnea that escalated quickly.  Felt that her legs got weak and she had to lay down.  Was then too weak to stand up.  Call for help and neighbor heard her who called EMS.  Placed on 2 L nasal cannula prior to my evaluation and states that she does feel better now.  Reportedly was 80% when she was in her vehicle when EMS arrived.  Past Medical History:  Diagnosis Date  . Diabetes mellitus without complication (HCC)   . Hypertension   . TIA (transient ischemic attack)     Patient Active Problem List   Diagnosis Date Noted  . Acute respiratory failure with hypoxia (HCC) 06/10/2018  . Diabetes mellitus without complication (HCC) 06/10/2018  . Depression with anxiety 06/10/2018  . Acute on chronic combined systolic and diastolic CHF (congestive heart failure) (HCC) 06/10/2018  . Uncontrolled  hypertension 05/17/2018  . Abnormal echocardiogram   . Syncope 05/15/2018  . Syncope and collapse 05/15/2018  . Type 2 diabetes mellitus (HCC) 05/15/2018  . History of TIA (transient ischemic attack) 05/15/2018  . LVH (left ventricular hypertrophy) 06/15/2013  . Pulmonary nodules/lesions, multiple 06/15/2013  . Possible Acute pericarditis, unspecified 06/14/2013  . Chest pain 06/13/2013    Past Surgical History:  Procedure Laterality Date  . LEFT HEART CATH AND CORONARY ANGIOGRAPHY N/A 05/17/2018   Procedure: LEFT HEART CATH AND CORONARY ANGIOGRAPHY;  Surgeon: Corky Crafts, MD;  Location: [facility] INVASIVE CV LAB;  Service: Cardiovascular;  Laterality: N/A;     OB History   No obstetric history on file.      Home Medications    Prior to Admission medications   Medication Sig Start Date End Date Taking? Authorizing Provider  escitalopram (LEXAPRO) 10 MG tablet Take 10 mg by mouth daily.   Yes [provider]  fluticasone (FLONASE) 50 MCG/ACT nasal spray 1 to 2 sprays each nostril as needed 07/10/13  Yes [provider]  folic acid (FOLVITE) 1 MG tablet Take 1 mg by mouth daily. 02/01/18  Yes [provider]  GARLIC PO Take 2 capsules by mouth daily.   Yes [provider]  hydrochlorothiazide (HYDRODIURIL) 12.5 MG tablet Take 2 tablets (25 mg total) by mouth daily. 05/17/18  Yes  Dhungel, Nishant, MD  leflunomide (ARAVA) 10 MG tablet Take 10 mg by mouth daily.   Yes [provider]  LORazepam (ATIVAN) 1 MG tablet Take 1 mg by mouth 2 (two) times daily as needed for anxiety.    Yes [provider]  meloxicam (MOBIC) 15 MG tablet Take 15 mg by mouth daily.   Yes [provider]  methotrexate (RHEUMATREX) 2.5 MG tablet Take 2.5 mg by mouth every Tuesday.  05/31/18  Yes [provider]  metoprolol tartrate (LOPRESSOR) 50 MG tablet Take 0.5 tablets (25 mg total) by mouth 2 (two) times daily for 30 days. Patient taking  differently: Take 75 mg by mouth 2 (two) times daily.  05/17/18 06/16/18 Yes Dhungel, Nishant, MD  Multiple Vitamin (MULTIVITAMIN WITH MINERALS) TABS tablet Take 1 tablet by mouth daily.   Yes [provider]  spironolactone (ALDACTONE) 25 MG tablet Take 0.5 tablets (12.5 mg total) by mouth daily. 04/04/18 07/03/18 Yes Lennette BihariKelly, Thomas A, MD  valsartan (DIOVAN) 320 MG tablet Take 320 mg by mouth daily. 02/01/18  Yes [provider]  Vitamin D, Ergocalciferol, (DRISDOL) 1.25 MG (50000 UT) CAPS capsule Take 1 capsule (50,000 Units total) by mouth every 7 (seven) days. 05/17/18  Yes Dhungel, Theda BelfastNishant, MD    Family History Family History  Problem Relation Age of Onset  . Colon cancer Brother   . Colon cancer Brother   . Lung cancer Sister        never smoked  . Heart disease Mother   . Breast cancer Mother   . Heart attack Sister   . Heart disease Brother   . Breast cancer Sister     Social History Social History   Tobacco Use  . Smoking status: Never Smoker  . Smokeless tobacco: Never Used  Substance Use Topics  . Alcohol use: No  . Drug use: No     Allergies   Patient has no known allergies.   Review of Systems Review of Systems  Constitutional: Positive for activity change and fatigue. Negative for chills and fever.  HENT: Negative for ear pain and sore throat.   Eyes: Negative for pain and visual disturbance.  Respiratory: Positive for shortness of breath. Negative for cough.   Cardiovascular: Negative for chest pain and palpitations.  Gastrointestinal: Negative for abdominal pain and vomiting.  Genitourinary: Negative for dysuria and hematuria.  Musculoskeletal: Negative for arthralgias and back pain.  Skin: Negative for color change and rash.  Neurological: Positive for weakness. Negative for seizures and syncope.  All other systems reviewed and are negative.    Physical Exam Updated Vital Signs BP (!) 153/82 (BP Location: Right Arm)   Pulse 85   Temp  98.1 F (36.7 C) (Oral)   Resp 16   SpO2 100%   Physical Exam Vitals signs and nursing note reviewed.  Constitutional:      General: She is not in acute distress.    Appearance: Normal appearance. She is well-developed. She is not ill-appearing.     Interventions: Nasal cannula in place.  HENT:     Head: Normocephalic and atraumatic.  Eyes:     Conjunctiva/sclera: Conjunctivae normal.  Neck:     Musculoskeletal: Neck supple.  Cardiovascular:     Rate and Rhythm: Normal rate and regular rhythm.     Heart sounds: No murmur.  Pulmonary:     Effort: Pulmonary effort is normal. No respiratory distress.     Breath sounds: Normal breath sounds.  Abdominal:  Palpations: Abdomen is soft.     Tenderness: There is no abdominal tenderness.  Skin:    General: Skin is warm and dry.  Neurological:     Mental Status: She is alert.  Psychiatric:        Behavior: Behavior is cooperative.      ED Treatments / Results  Labs (all labs ordered are listed, but only abnormal results are displayed) Labs Reviewed  CBC WITH DIFFERENTIAL/PLATELET - Abnormal; Notable for the following components:      Result Value   WBC 10.8 (*)    Hemoglobin 11.9 (*)    Neutro Abs 8.8 (*)    Abs Immature Granulocytes 0.16 (*)    All other components within normal limits  COMPREHENSIVE METABOLIC PANEL - Abnormal; Notable for the following components:   Glucose, Bld 150 (*)    Creatinine, Ser 1.11 (*)    GFR calc non Af Amer 49 (*)    GFR calc Af Amer 56 (*)    All other components within normal limits  BRAIN NATRIURETIC PEPTIDE - Abnormal; Notable for the following components:   B Natriuretic Peptide 714.7 (*)    All other components within normal limits  D-DIMER, QUANTITATIVE (NOT AT [facility]) - Abnormal; Notable for the following components:   D-Dimer, Quant 3.63 (*)    All other components within normal limits  CBG MONITORING, ED - Abnormal; Notable for the following components:   Glucose-Capillary  132 (*)    All other components within normal limits  I-STAT TROPONIN, ED    EKG EKG Interpretation  Date/Time:  Friday June 10 2018 19:38:38 EST Ventricular Rate:  88 PR Interval:    QRS Duration: 106 QT Interval:  382 QTC Calculation: 463 R Axis:   113 Text Interpretation:  Sinus rhythm Premature ventricular complexes Baseline wander hypertrophic changes No significant change since last tracing Abnormal ekg Confirmed by Gerhard Munch [phone]) on 06/10/2018 7:48:53 PM   Radiology Ct Angio Chest Pe W And/or Wo Contrast  Result Date: 06/10/2018 CLINICAL DATA:  78 y/o F; 78 y/o F; 6 weeks of shortness of breath. EXAM: CT ANGIOGRAPHY CHEST WITH CONTRAST TECHNIQUE: Multidetector CT imaging of the chest was performed using the standard protocol during bolus administration of intravenous contrast. Multiplanar CT image reconstructions and MIPs were obtained to evaluate the vascular anatomy. CONTRAST:  63 cc Isovue 370 COMPARISON:  07/23/2014 CT chest. FINDINGS: Cardiovascular: Mild aortic and coronary artery calcific atherosclerosis. Normal caliber aorta and main pulmonary artery. Mild cardiomegaly. No pericardial effusion. Satisfactory opacification of the pulmonary arteries. No pulmonary embolus. Mediastinum/Nodes: No enlarged mediastinal, hilar, or axillary lymph nodes. Thyroid gland, trachea, and esophagus demonstrate no significant findings. Lungs/Pleura: Smooth interlobular septal thickening, central ground-glass opacities, peribronchial thickening, moderate right pleural effusion, small left pleural effusion. No consolidation or pneumothorax. Upper Abdomen: Partially visualized exophytic cyst of the right kidney measuring up to 3.8 cm. Small hiatal hernia. Musculoskeletal: Right 5-6 lateral rib mildly displaced fractures. Review of the MIP images confirms the above findings. IMPRESSION: 1. Interstitial and mild alveolar pulmonary edema. Moderate right and small left pleural effusions. 2. Mild  cardiomegaly. 3. Mild coronary artery calcific atherosclerosis and Aortic Atherosclerosis (ICD10-I70.0). 4. Acute to subacute right lateral mildly displaced 5 and 6 rib fractures. Electronically Signed   By: Mitzi Hansen M.D.   On: 06/10/2018 22:34   Dg Chest Portable 1 View  Result Date: 06/10/2018 CLINICAL DATA:  Shortness of breath. EXAM: PORTABLE CHEST 1 VIEW COMPARISON:  05/15/2018. FINDINGS: Stable mild enlargement  of the cardiac silhouette. Small amount of airspace opacity at the right lateral lung base with improvement. Probable minimal right pleural effusion with improvement. Mildly prominent interstitial markings with improvement. Diffuse osteopenia, thoracic spine degenerative changes and bilateral shoulder degenerative changes. IMPRESSION: 1. Improving changes of congestive heart failure with stable cardiomegaly. 2. Improved right basilar atelectasis, alveolar edema or pneumonia with minimal residual laterally. Electronically Signed   By: Beckie Salts M.D.   On: 06/10/2018 20:29    Procedures Procedures (including critical care time)  Medications Ordered in ED Medications  iopamidol (ISOVUE-370) 76 % injection 75 mL (75 mLs Intravenous Contrast Given 06/10/18 2208)     Initial Impression / Assessment and Plan / ED Course  I have reviewed the triage vital signs and the nursing notes.  Pertinent labs & imaging results that were available during my care of the patient were reviewed by me and considered in my medical decision making (see chart for details).     MDM:  Imaging: Chest x-ray shows improving changes of CHF with stable cardiomegaly.  Improved right basilar atelectasis type appearance.  PE study shows interstitial and mild alveolar pulmonary edema.  Moderate right and small left pleural effusions.  Mild cardiomegaly.  Mild coronary artery atherosclerosis.  Acute to subacute right lateral mildly displaced fifth and sixth rib fractures.  ED Provider Interpretation  of EKG: Sinus rhythm with a rate of 88 bpm, right axis deviation, subtle ST segment elevation isolated to V2.  Subtle ST segment depression in leads V4 through V6.  No pathologic T wave abnormalities.  Intervals are largely normal with exception of prolonged PR at 207.  No bundle-branch blocks.  Labs: CBC and CMP are unremarkable, BNP 714 (prior in the low 200s), troponin 0.02  On initial evaluation, patient appears stable. Afebrile and hemodynamically stable on new 2L O2 req't. Alert and oriented x4, pleasant, and cooperative.  Presents with dyspnea as above.  To review shows echo on 05/16/2018 with LVEF of 40 to 45% and anterior hypokinesis.  Grade 1 diastolic dysfunction.  Left heart cath on 05/17/2018 showed grossly normal systolic function with minimal diffuse nonobstructive CAD recommending continued medical therapy with no PCI or DES at that time.  On exam, patient does not appear grossly volume overloaded.  Only trace edema to the lower extremities bilaterally.  Her lungs are largely clear although slightly diminished in the bases.  Satting around 96 to 97% on 2 L nasal cannula which is a new oxygen requirement for her.  No history of primary lung disorders.  History of CHF but reports compliance with her therapies and recent invasive testing as above is unremarkable.  Is unsure about orthopnea and denies PND.  However, BNP is elevated at over 700 with prior available comparisons in the low 200s.  Her echo did show decreased EF in mid January.  Raises possibility of acute CHF exacerbation causing acute hypoxic respiratory failure.   Cough is nonproductive and she has no significant leukocytosis.  No fevers or chills.  Appetite normal.  No other constitutional signs or symptoms.  Doubt infectious etiology.  Her symptoms started abruptly this evening while exerting herself and her chest x-ray does not appear to have significant evidence for volume overload and actually appears improved from prior.  This  raises concern for possible PE.  After discussing with hospitalist (admitting) team decision was made to order a d-dimer which was elevated at 3.63.   PE study ordered showing edema and small pleural effusions as detailed above.  She has no tenderness about the possible rib fractures as above and again denied fall tonight.  Reports a fall several weeks ago.  Denies pain and does not want analgesics at this time.  Discussed with admitting team IV Lasix in the ED and Dr. Clyde Lundborg stated that he would order Lasix.  She was admitted to the hospitalist service in stable condition.  The plan for this patient was discussed with Dr. Jeraldine Loots who voiced agreement and who oversaw evaluation and treatment of this patient.   The patient was fully informed and involved with the history taking, evaluation, workup including labs/images, and plan. The patient's concerns and questions were addressed to the patient's satisfaction and she expressed agreement with the plan to admit.    Final Clinical Impressions(s) / ED Diagnoses   Final diagnoses:  Acute respiratory failure with hypoxia (HCC)  Elevated brain natriuretic peptide (BNP) level  Acute on chronic congestive heart failure, unspecified heart failure type [facility])    ED Discharge Orders    None       Jaison Petraglia, Sherryle Lis, MD 06/10/18 2250    Gerhard Munch, MD 06/11/18 1515

## 2018-06-10 NOTE — ED Triage Notes (Signed)
Patient with shortness of breath and cough for the last two weeks.  Patient walked to vehicle tonight and had oxygen sat of 80% when EMS arrived.  Patient BS with diminished on the left.  She has noticed some edema to ankles bilaterally since November.  Patient does not have any CHF history.

## 2018-06-11 ENCOUNTER — Observation Stay (HOSPITAL_BASED_OUTPATIENT_CLINIC_OR_DEPARTMENT_OTHER): Payer: Medicare Other

## 2018-06-11 ENCOUNTER — Other Ambulatory Visit: Payer: Self-pay

## 2018-06-11 DIAGNOSIS — J9601 Acute respiratory failure with hypoxia: Secondary | ICD-10-CM | POA: Diagnosis not present

## 2018-06-11 DIAGNOSIS — I5043 Acute on chronic combined systolic (congestive) and diastolic (congestive) heart failure: Secondary | ICD-10-CM | POA: Diagnosis not present

## 2018-06-11 DIAGNOSIS — R7989 Other specified abnormal findings of blood chemistry: Secondary | ICD-10-CM

## 2018-06-11 DIAGNOSIS — I10 Essential (primary) hypertension: Secondary | ICD-10-CM

## 2018-06-11 DIAGNOSIS — F418 Other specified anxiety disorders: Secondary | ICD-10-CM | POA: Diagnosis not present

## 2018-06-11 DIAGNOSIS — E119 Type 2 diabetes mellitus without complications: Secondary | ICD-10-CM | POA: Diagnosis not present

## 2018-06-11 LAB — GLUCOSE, CAPILLARY
Glucose-Capillary: 106 mg/dL — ABNORMAL HIGH (ref 70–99)
Glucose-Capillary: 97 mg/dL (ref 70–99)
Glucose-Capillary: 115 mg/dL — ABNORMAL HIGH (ref 70–99)
Glucose-Capillary: 103 mg/dL — ABNORMAL HIGH (ref 70–99)
Glucose-Capillary: 118 mg/dL — ABNORMAL HIGH (ref 70–99)

## 2018-06-11 MED ORDER — FOLIC ACID 1 MG PO TABS
1.0000 mg | ORAL_TABLET | Freq: Every day | ORAL | Status: DC
  Administered 2018-06-11 – 2018-06-12 (×2): 1 mg via ORAL
  Filled 2018-06-11 (×2): qty 1

## 2018-06-11 MED ORDER — IRBESARTAN 300 MG PO TABS
300.0000 mg | ORAL_TABLET | Freq: Every day | ORAL | Status: DC
  Administered 2018-06-11 – 2018-06-12 (×2): 300 mg via ORAL
  Filled 2018-06-11 (×2): qty 1

## 2018-06-11 MED ORDER — SODIUM CHLORIDE 0.9 % IV SOLN: 250.0000 mL | INTRAVENOUS | Status: DC | PRN

## 2018-06-11 MED ORDER — ESCITALOPRAM OXALATE 10 MG PO TABS
10.0000 mg | ORAL_TABLET | Freq: Every day | ORAL | Status: DC
  Administered 2018-06-11 – 2018-06-12 (×2): 10 mg via ORAL
  Filled 2018-06-11 (×2): qty 1

## 2018-06-11 MED ORDER — ONDANSETRON HCL 4 MG/2ML IJ SOLN: 4.0000 mg | Freq: Four times a day (QID) | INTRAMUSCULAR | Status: DC | PRN

## 2018-06-11 MED ORDER — LEFLUNOMIDE 20 MG PO TABS
10.0000 mg | ORAL_TABLET | Freq: Every day | ORAL | Status: DC
  Administered 2018-06-11 – 2018-06-12 (×2): 10 mg via ORAL
  Filled 2018-06-11 (×2): qty 0.5

## 2018-06-11 MED ORDER — INSULIN ASPART 100 UNIT/ML ~~LOC~~ SOLN: 0.0000 [IU] | Freq: Every day | SUBCUTANEOUS | Status: DC

## 2018-06-11 MED ORDER — GARLIC 300 MG PO TABS: ORAL_TABLET | Freq: Every day | ORAL | Status: DC

## 2018-06-11 MED ORDER — ASPIRIN EC 81 MG PO TBEC
81.0000 mg | DELAYED_RELEASE_TABLET | Freq: Every day | ORAL | Status: DC
  Administered 2018-06-12: 81 mg via ORAL
  Filled 2018-06-11 (×2): qty 1

## 2018-06-11 MED ORDER — ADULT MULTIVITAMIN W/MINERALS CH
1.0000 | ORAL_TABLET | Freq: Every day | ORAL | Status: DC
  Administered 2018-06-11 – 2018-06-12 (×2): 1 via ORAL
  Filled 2018-06-11 (×2): qty 1

## 2018-06-11 MED ORDER — MELOXICAM 7.5 MG PO TABS
15.0000 mg | ORAL_TABLET | Freq: Every day | ORAL | Status: DC
  Administered 2018-06-11: 15 mg via ORAL
  Filled 2018-06-11: qty 2

## 2018-06-11 MED ORDER — ENOXAPARIN SODIUM 40 MG/0.4ML ~~LOC~~ SOLN
40.0000 mg | SUBCUTANEOUS | Status: DC
  Administered 2018-06-11: 40 mg via SUBCUTANEOUS
  Filled 2018-06-11: qty 0.4

## 2018-06-11 MED ORDER — SODIUM CHLORIDE 0.9% FLUSH
3.0000 mL | Freq: Two times a day (BID) | INTRAVENOUS | Status: DC
  Administered 2018-06-11: 3 mL via INTRAVENOUS

## 2018-06-11 MED ORDER — INSULIN ASPART 100 UNIT/ML ~~LOC~~ SOLN: 0.0000 [IU] | Freq: Three times a day (TID) | SUBCUTANEOUS | Status: DC

## 2018-06-11 MED ORDER — SODIUM CHLORIDE 0.9% FLUSH: 3.0000 mL | INTRAVENOUS | Status: DC | PRN

## 2018-06-11 MED ORDER — ACETAMINOPHEN 325 MG PO TABS: 650.0000 mg | ORAL_TABLET | ORAL | Status: DC | PRN

## 2018-06-11 MED ORDER — LORAZEPAM 1 MG PO TABS: 1.0000 mg | ORAL_TABLET | Freq: Two times a day (BID) | ORAL | Status: DC | PRN

## 2018-06-11 MED ORDER — METOPROLOL TARTRATE 50 MG PO TABS
75.0000 mg | ORAL_TABLET | Freq: Two times a day (BID) | ORAL | Status: DC
  Administered 2018-06-11: 75 mg via ORAL
  Filled 2018-06-11 (×4): qty 1

## 2018-06-11 NOTE — Progress Notes (Signed)
Patient transported to vascular lab.

## 2018-06-11 NOTE — Progress Notes (Signed)
Progress Note    Melinda Mann  BVQ:[id number] DOB: January 13, 1941  DOA: 06/10/2018 PCP: Irena Reichmann, DO    Brief Narrative:   Chief complaint: Shortness of breath  Medical records reviewed and are as summarized below:  Melinda Mann is an 78 y.o. female with past medical history significant for hypertension, congestive heart failure with EF 40%, sciatica, diabetes, TIA, anxiety, and depression; who presented with planes of shortness of breath.  Initially found to have O2 saturations in the 80s.  Assessment/Plan:   Principal Problem:   Acute on chronic combined systolic and diastolic CHF (congestive heart failure) (HCC) Active Problems:   History of TIA (transient ischemic attack)   Acute respiratory failure with hypoxia (HCC)   Diabetes mellitus without complication (HCC)   Depression with anxiety   HTN (hypertension)  Respiratory failure with hypoxia 2/2 chronic combined systolic and diastolic CHF: Complaint of progressively worsening shortness of breath and intermittent leg swelling.  Patient still has crackles on physical exam.  BNP seven 714.7.  D-dimer was noted to be elevated and CT angiogram of the chest noting interstitial edema with pleural effusions.  Patient was previously set up to see Dr. Tresa Endo of cardiology, but missed the appointment after passing of her sister.  Patient has been started on Lasix 40 mg IV.  -Continuous pulse oximetry with nasal cannula oxygen as needed -Strict I&Os and daily weights -Continue Lasix IV twice daily -Consider restarting oral hydrochlorothiazide/switching to po Lasix along with spironolactone when able -Will need to reschedule outpatient follow-up with Dr. Tresa Endo of cardiology   Leukocytosis: Mildly elevated WBC of 10.8 noted on admission.  No other significant signs of infection noted. -Recheck CBC in a.m.  Diabetes mellitus type 2, without complication: No recent hemoglobin A1c on file.  Hold medications of metformin -Hypoglycemic  protocol -CBGs q. before meals and at bedtime with sensitive sliding scale insulin  Rib fractures secondary to falls: Patient reports recently having more falls.  Symptoms can occur when she just turns her head.  She reports having cane and walker at home, but does not regularly use them. -Physical therapy to eval and treat -Incentive spirometry  Rheumatoid arthritis -Continue leflunomide  Anxiety and depression -Continue Lexapro  Elevated d-dimer acute.  D-dimer noted to be 3.63.  CT angiogram of the chest negative for pulmonary embolus.  Doppler ultrasound of bilateral lower extremities negative for signs of blood clot.  History of TIA -Continue aspirin  Body mass index is 29.92 kg/m.   Family Communication/Anticipated D/C date and plan/Code Status   DVT prophylaxis: Lovenox ordered. Code Status: Full Code.  Family Communication: No family present at bedside Disposition Plan: Possible discharge home in 1 to 2 days   Medical Consultants:    None.   Anti-Infectives:    None  Subjective:   Patient reports that she missed her appointment with Dr. Tresa Endo of cardiology after her sister died and currently lives with her daughter.  She reports recently falling more often and in the last 2 weeks fell hitting the right side of her chest.  Falls can occur whenever she just turns her head, but denies having any loss of consciousness.  Patient also reports that her weight has been trending down from 196 down to 185 pounds.  Objective:    Vitals:   06/11/18 0452 06/11/18 0806 06/11/18 1128 06/11/18 1554  BP: (!) 156/66 136/66 (!) 130/54 (!) 145/56  Pulse: (!) 56 (!) 54 62 61  Resp: 20 19 18  18  Temp: (!) 97.5 F (36.4 C) 98.6 F (37 C) 98.1 F (36.7 C) 98 F (36.7 C)  TempSrc: Oral  Oral Oral  SpO2: 94% 100% 96% 93%  Weight:      Height:        Intake/Output Summary (Last 24 hours) at 06/11/2018 1715 Last data filed at 06/11/2018 1331 Gross per 24 hour  Intake 465 ml   Output 762 ml  Net -297 ml   Filed Weights   06/11/18 0008  Weight: 84.1 kg    Exam: Constitutional: Female in NAD, calm, comfortable Eyes: PERRL, lids and conjunctivae normal ENMT: Mucous membranes are moist. Posterior pharynx clear of any exudate or lesions.   Neck: normal, supple, no masses, no thyromegaly Respiratory: Positive crackles appreciated throughout both mid and lower lung fields.  No significant wheezes or crackles appreciated.  Currently on 2 L of nasal cannula oxygen.  Patient able to talk in complete sentences. Cardiovascular: Regular rate and rhythm, no murmurs / rubs / gallops. No extremity edema. 2+ pedal pulses. No carotid bruits.  Tenderness palpation of the right chest wall. Abdomen: no tenderness, no masses palpated. No hepatosplenomegaly. Bowel sounds positive.  Musculoskeletal: no clubbing / cyanosis. No joint deformity upper and lower extremities. Good ROM, no contractures. Normal muscle tone.  Skin: Abrasion to the left shin. Neurologic: CN 2-12 grossly intact. Sensation intact, DTR normal. Strength 5/5 in all 4.  Psychiatric: Normal judgment and insight. Alert and oriented x 3. Normal mood.    Data Reviewed:   I have personally reviewed following labs and imaging studies:  Labs: Labs show the following:   Basic Metabolic Panel: Recent Labs  Lab 06/10/18 2000  NA 140  K 4.3  CL 105  CO2 22  GLUCOSE 150*  BUN 18  CREATININE 1.11*  CALCIUM 9.4   GFR Estimated Creatinine Clearance: 47.8 mL/min (A) (by C-G formula based on SCr of 1.11 mg/dL (H)). Liver Function Tests: Recent Labs  Lab 06/10/18 2000  AST 22  ALT 19  ALKPHOS 80  BILITOT 0.4  PROT 7.0  ALBUMIN 3.7   No results for input(s): LIPASE, AMYLASE in the last 168 hours. No results for input(s): AMMONIA in the last 168 hours. Coagulation profile No results for input(s): INR, PROTIME in the last 168 hours.  CBC: Recent Labs  Lab 06/10/18 2000  WBC 10.8*  NEUTROABS 8.8*    HGB 11.9*  HCT 39.5  MCV 95.6  PLT 243   Cardiac Enzymes: No results for input(s): CKTOTAL, CKMB, CKMBINDEX, TROPONINI in the last 168 hours. BNP (last 3 results) No results for input(s): PROBNP in the last 8760 hours. CBG: Recent Labs  Lab 06/10/18 2038 06/11/18 0101 06/11/18 0810 06/11/18 1143 06/11/18 1648  GLUCAP 132* 106* 97 115* 103*   D-Dimer: Recent Labs    06/10/18 2109  DDIMER 3.63*   Hgb A1c: No results for input(s): HGBA1C in the last 72 hours. Lipid Profile: No results for input(s): CHOL, HDL, LDLCALC, TRIG, CHOLHDL, LDLDIRECT in the last 72 hours. Thyroid function studies: No results for input(s): TSH, T4TOTAL, T3FREE, THYROIDAB in the last 72 hours.  Invalid input(s): FREET3 Anemia work up: No results for input(s): VITAMINB12, FOLATE, FERRITIN, TIBC, IRON, RETICCTPCT in the last 72 hours. Sepsis Labs: Recent Labs  Lab 06/10/18 2000  WBC 10.8*    Microbiology No results found for this or any previous visit (from the past 240 hour(s)).  Procedures and diagnostic studies:  Ct Angio Chest Pe W And/or Wo Contrast  Result Date: 06/10/2018 CLINICAL DATA:  78 y/o F; 78 y/o F; 6 weeks of shortness of breath. EXAM: CT ANGIOGRAPHY CHEST WITH CONTRAST TECHNIQUE: Multidetector CT imaging of the chest was performed using the standard protocol during bolus administration of intravenous contrast. Multiplanar CT image reconstructions and MIPs were obtained to evaluate the vascular anatomy. CONTRAST:  63 cc Isovue 370 COMPARISON:  07/23/2014 CT chest. FINDINGS: Cardiovascular: Mild aortic and coronary artery calcific atherosclerosis. Normal caliber aorta and main pulmonary artery. Mild cardiomegaly. No pericardial effusion. Satisfactory opacification of the pulmonary arteries. No pulmonary embolus. Mediastinum/Nodes: No enlarged mediastinal, hilar, or axillary lymph nodes. Thyroid gland, trachea, and esophagus demonstrate no significant findings. Lungs/Pleura: Smooth  interlobular septal thickening, central ground-glass opacities, peribronchial thickening, moderate right pleural effusion, small left pleural effusion. No consolidation or pneumothorax. Upper Abdomen: Partially visualized exophytic cyst of the right kidney measuring up to 3.8 cm. Small hiatal hernia. Musculoskeletal: Right 5-6 lateral rib mildly displaced fractures. Review of the MIP images confirms the above findings. IMPRESSION: 1. Interstitial and mild alveolar pulmonary edema. Moderate right and small left pleural effusions. 2. Mild cardiomegaly. 3. Mild coronary artery calcific atherosclerosis and Aortic Atherosclerosis (ICD10-I70.0). 4. Acute to subacute right lateral mildly displaced 5 and 6 rib fractures. Electronically Signed   By: Mitzi HansenLance  Furusawa-Stratton M.D.   On: 06/10/2018 22:34   Dg Chest Portable 1 View  Result Date: 06/10/2018 CLINICAL DATA:  Shortness of breath. EXAM: PORTABLE CHEST 1 VIEW COMPARISON:  05/15/2018. FINDINGS: Stable mild enlargement of the cardiac silhouette. Small amount of airspace opacity at the right lateral lung base with improvement. Probable minimal right pleural effusion with improvement. Mildly prominent interstitial markings with improvement. Diffuse osteopenia, thoracic spine degenerative changes and bilateral shoulder degenerative changes. IMPRESSION: 1. Improving changes of congestive heart failure with stable cardiomegaly. 2. Improved right basilar atelectasis, alveolar edema or pneumonia with minimal residual laterally. Electronically Signed   By: Beckie SaltsSteven  Reid M.D.   On: 06/10/2018 20:29   Vas [place] Lower Extremity Venous (dvt)  Result Date: 06/11/2018  Lower Venous Study Indications: Positive d dimer.  Performing Technologist: Blanch MediaMegan Riddle RVS  Examination Guidelines: A complete evaluation includes B-mode imaging, spectral Doppler, color Doppler, and power Doppler as needed of all accessible portions of each vessel. Bilateral testing is considered an integral part  of a complete examination. Limited examinations for reoccurring indications may be performed as noted.  Right Venous Findings: +---------+---------------+---------+-----------+----------+-------+          CompressibilityPhasicitySpontaneityPropertiesSummary +---------+---------------+---------+-----------+----------+-------+ CFV      Full           Yes      Yes                          +---------+---------------+---------+-----------+----------+-------+ SFJ      Full                                                 +---------+---------------+---------+-----------+----------+-------+ FV Prox  Full                                                 +---------+---------------+---------+-----------+----------+-------+ FV Mid   Full                                                 +---------+---------------+---------+-----------+----------+-------+  FV DistalFull                                                 +---------+---------------+---------+-----------+----------+-------+ PFV      Full                                                 +---------+---------------+---------+-----------+----------+-------+ POP      Full           Yes      Yes                          +---------+---------------+---------+-----------+----------+-------+ PTV      Full                                                 +---------+---------------+---------+-----------+----------+-------+ PERO     Full                                                 +---------+---------------+---------+-----------+----------+-------+  Left Venous Findings: +---------+---------------+---------+-----------+----------+-------+          CompressibilityPhasicitySpontaneityPropertiesSummary +---------+---------------+---------+-----------+----------+-------+ CFV      Full           Yes      Yes                          +---------+---------------+---------+-----------+----------+-------+ SFJ       Full                                                 +---------+---------------+---------+-----------+----------+-------+ FV Prox  Full                                                 +---------+---------------+---------+-----------+----------+-------+ FV Mid   Full                                                 +---------+---------------+---------+-----------+----------+-------+ FV DistalFull                                                 +---------+---------------+---------+-----------+----------+-------+ PFV      Full                                                 +---------+---------------+---------+-----------+----------+-------+  POP      Full           Yes      Yes                          +---------+---------------+---------+-----------+----------+-------+ PTV      Full                                                 +---------+---------------+---------+-----------+----------+-------+ PERO     Full                                                 +---------+---------------+---------+-----------+----------+-------+    Summary: Right: There is no evidence of deep vein thrombosis in the lower extremity. No cystic structure found in the popliteal fossa. Left: There is no evidence of deep vein thrombosis in the lower extremity. No cystic structure found in the popliteal fossa.  *See table(s) above for measurements and observations. Electronically signed by Sherald Hess MD on 06/11/2018 at 10:27:00 AM.    Final     Medications:   . aspirin EC  81 mg Oral Daily  . enoxaparin (LOVENOX) injection  40 mg Subcutaneous Q24H  . escitalopram  10 mg Oral Daily  . folic acid  1 mg Oral Daily  . furosemide  40 mg Intravenous Q12H  . insulin aspart  0-5 Units Subcutaneous QHS  . insulin aspart  0-9 Units Subcutaneous TID WC  . irbesartan  300 mg Oral Daily  . leflunomide  10 mg Oral Daily  . meloxicam  15 mg Oral Daily  . metoprolol tartrate  75 mg  Oral BID  . multivitamin with minerals  1 tablet Oral Daily  . sodium chloride flush  3 mL Intravenous Q12H   Continuous Infusions: . sodium chloride       LOS: 0 days   Melinda Mann  [facility]   *Please refer to [email or web address], password [email or web address] to get updated schedule on who will round on this patient, as hospitalists switch teams weekly. If 7PM-7AM, please contact night-coverage at [email or web address], password [email or web address] for any overnight needs.

## 2018-06-11 NOTE — Plan of Care (Signed)
  Problem: Education: Goal: Ability to demonstrate management of disease process will improve Outcome: Progressing Goal: Ability to verbalize understanding of medication therapies will improve Outcome: Progressing   Problem: Activity: Goal: Capacity to carry out activities will improve Outcome: Progressing   Problem: Cardiac: Goal: Ability to achieve and maintain adequate cardiopulmonary perfusion will improve Outcome: Progressing   Problem: Education: Goal: Knowledge of General Education information will improve Description Including pain rating scale, medication(s)/side effects and non-pharmacologic comfort measures Outcome: Progressing   Problem: Health Behavior/Discharge Planning: Goal: Ability to manage health-related needs will improve Outcome: Progressing   Problem: Clinical Measurements: Goal: Ability to maintain clinical measurements within normal limits will improve Outcome: Progressing Goal: Will remain free from infection Outcome: Progressing Goal: Diagnostic test results will improve Outcome: Progressing Goal: Cardiovascular complication will be avoided Outcome: Progressing   Problem: Activity: Goal: Risk for activity intolerance will decrease Outcome: Progressing   Problem: Nutrition: Goal: Adequate nutrition will be maintained Outcome: Progressing   Problem: Coping: Goal: Level of anxiety will decrease Outcome: Progressing   Problem: Elimination: Goal: Will not experience complications related to bowel motility Outcome: Progressing Goal: Will not experience complications related to urinary retention Outcome: Progressing   Problem: Pain Managment: Goal: General experience of comfort will improve Outcome: Progressing   Problem: Safety: Goal: Ability to remain free from injury will improve Outcome: Progressing   Problem: Skin Integrity: Goal: Risk for impaired skin integrity will decrease Outcome: Progressing   

## 2018-06-11 NOTE — Progress Notes (Signed)
Bilateral lower extremity venous duplex has been completed.   Preliminary results in CV Proc.   Blanch Media 06/11/2018 8:34 AM

## 2018-06-11 NOTE — Evaluation (Signed)
Physical Therapy Evaluation Patient Details Name: Melinda Mann MRN: [id number] DOB: 09/25/1940 Today's Date: 06/11/2018   History of Present Illness  Pt is a 78 y/o female admitted secondary to SOB with CHF exacerbation. Dopplers were negative for DVTs bilaterally. PMH including but not limited to DM, HTN, TIA and CHF.    Clinical Impression  Pt presented supine in bed with HOB elevated, awake and willing to participate in therapy session. Prior to admission, pt reported that she was independent with all functional mobility and ADLs. Pt lives with her daughter in a two level home with two steps to enter. She is currently at supervision to modified independence with functional mobility. Pt ambulated in hallway without use of an AD with supervision for safety. Pt on RA throughout with SPO2 maintaining >90%. Pt stated that she is much better with regards to mobility than prior to admission. No further acute PT needs identified at this time. PT signing off.     Follow Up Recommendations No PT follow up    Equipment Recommendations  None recommended by PT    Recommendations for Other Services       Precautions / Restrictions Precautions Precautions: Fall Restrictions Weight Bearing Restrictions: No      Mobility  Bed Mobility Overal bed mobility: Modified Independent                Transfers Overall transfer level: Needs assistance Equipment used: None Transfers: Sit to/from Stand Sit to Stand: Supervision         General transfer comment: supervision for safety  Ambulation/Gait Ambulation/Gait assistance: Min guard;Supervision Gait Distance (Feet): 100 Feet Assistive device: None Gait Pattern/deviations: Step-through pattern;Decreased stride length Gait velocity: decreased   General Gait Details: pt with slow, cautious gait but no overt instability or LOB, progressing from min guard initially to supervision; pt limited in distance secondary to fatigue.   Stairs            Wheelchair Mobility    Modified Rankin (Stroke Patients Only)       Balance Overall balance assessment: Needs assistance Sitting-balance support: Feet supported Sitting balance-Leahy Scale: Good     Standing balance support: No upper extremity supported Standing balance-Leahy Scale: Fair                               Pertinent Vitals/Pain Pain Assessment: No/denies pain    Home Living Family/patient expects to be discharged to:: Private residence Living Arrangements: Children Available Help at Discharge: Family;Available 24 hours/day Type of Home: House Home Access: Stairs to enter Entrance Stairs-Rails: Right;Left;Can reach both Entrance Stairs-Number of Steps: 2 Home Layout: Able to live on main level with bedroom/bathroom Home Equipment: Shower seat;Grab bars - tub/shower;Wheelchair - [employer] - 2 wheels;Cane - single point;Walker - 4 wheels      Prior Function Level of Independence: Independent         Comments: drives     Hand Dominance        Extremity/Trunk Assessment   Upper Extremity Assessment Upper Extremity Assessment: Overall WFL for tasks assessed    Lower Extremity Assessment Lower Extremity Assessment: Overall WFL for tasks assessed    Cervical / Trunk Assessment Cervical / Trunk Assessment: Normal  Communication   Communication: No difficulties  Cognition Arousal/Alertness: Awake/alert Behavior During Therapy: WFL for tasks assessed/performed Overall Cognitive Status: Within Functional Limits for tasks assessed  General Comments      Exercises     Assessment/Plan    PT Assessment Patent does not need any further PT services  PT Problem List         PT Treatment Interventions      PT Goals (Current goals can be found in the Care Plan section)  Acute Rehab PT Goals Patient Stated Goal: to feel better PT Goal Formulation: All  assessment and education complete, DC therapy    Frequency     Barriers to discharge        Co-evaluation               AM-PAC PT "6 Clicks" Mobility  Outcome Measure Help needed turning from your back to your side while in a flat bed without using bedrails?: None Help needed moving from lying on your back to sitting on the side of a flat bed without using bedrails?: None Help needed moving to and from a bed to a chair (including a wheelchair)?: None Help needed standing up from a chair using your arms (e.g., wheelchair or bedside chair)?: None Help needed to walk in hospital room?: None Help needed climbing 3-5 steps with a railing? : A Little 6 Click Score: 23    End of Session Equipment Utilized During Treatment: Gait belt Activity Tolerance: Patient tolerated treatment well;Patient limited by fatigue Patient left: in bed;with call bell/phone within reach Nurse Communication: Mobility status PT Visit Diagnosis: Other abnormalities of gait and mobility (R26.89)    Time: 5188-4166 PT Time Calculation (min) (ACUTE ONLY): 15 min   Charges:   PT Evaluation $PT Eval Moderate Complexity: 1 Mod          Deborah Chalk, PT, DPT  Acute Rehabilitation Services Pager [phone] Office [phone]    Alessandra Bevels Gitty Osterlund 06/11/2018, 4:35 PM

## 2018-06-11 NOTE — Plan of Care (Signed)
  Problem: Education: Goal: Ability to demonstrate management of disease process will improve Outcome: Progressing Goal: Ability to verbalize understanding of medication therapies will improve Outcome: Progressing Goal: Individualized Educational Video(s) Outcome: Progressing   Problem: Activity: Goal: Capacity to carry out activities will improve Outcome: Progressing   Problem: Cardiac: Goal: Ability to achieve and maintain adequate cardiopulmonary perfusion will improve Outcome: Progressing   Problem: Education: Goal: Knowledge of General Education information will improve Description Including pain rating scale, medication(s)/side effects and non-pharmacologic comfort measures Outcome: Progressing   Problem: Health Behavior/Discharge Planning: Goal: Ability to manage health-related needs will improve Outcome: Progressing   Problem: Clinical Measurements: Goal: Ability to maintain clinical measurements within normal limits will improve Outcome: Progressing Goal: Will remain free from infection Outcome: Progressing Goal: Diagnostic test results will improve Outcome: Progressing Goal: Respiratory complications will improve Outcome: Progressing Goal: Cardiovascular complication will be avoided Outcome: Progressing   Problem: Coping: Goal: Level of anxiety will decrease Outcome: Progressing   Problem: Nutrition: Goal: Adequate nutrition will be maintained Outcome: Progressing   Problem: Elimination: Goal: Will not experience complications related to bowel motility Outcome: Progressing Goal: Will not experience complications related to urinary retention Outcome: Progressing   Problem: Pain Managment: Goal: General experience of comfort will improve Outcome: Progressing   Problem: Safety: Goal: Ability to remain free from injury will improve Outcome: Progressing   Problem: Skin Integrity: Goal: Risk for impaired skin integrity will decrease Outcome:  Progressing

## 2018-06-12 DIAGNOSIS — F418 Other specified anxiety disorders: Secondary | ICD-10-CM | POA: Diagnosis not present

## 2018-06-12 DIAGNOSIS — E119 Type 2 diabetes mellitus without complications: Secondary | ICD-10-CM | POA: Diagnosis not present

## 2018-06-12 DIAGNOSIS — E876 Hypokalemia: Secondary | ICD-10-CM

## 2018-06-12 DIAGNOSIS — J9601 Acute respiratory failure with hypoxia: Secondary | ICD-10-CM | POA: Diagnosis not present

## 2018-06-12 DIAGNOSIS — I5043 Acute on chronic combined systolic (congestive) and diastolic (congestive) heart failure: Secondary | ICD-10-CM | POA: Diagnosis not present

## 2018-06-12 LAB — BASIC METABOLIC PANEL
Anion gap: 13 (ref 5–15)
BUN: 18 mg/dL (ref 8–23)
CHLORIDE: 99 mmol/L (ref 98–111)
CO2: 28 mmol/L (ref 22–32)
Calcium: 9.3 mg/dL (ref 8.9–10.3)
Creatinine, Ser: 1.01 mg/dL — ABNORMAL HIGH (ref 0.44–1.00)
GFR calc Af Amer: >60 mL/min (ref >60)
GFR calc non Af Amer: 54 mL/min — ABNORMAL LOW (ref >60)
Glucose, Bld: 116 mg/dL — ABNORMAL HIGH (ref 70–99)
Potassium: 3.3 mmol/L — ABNORMAL LOW (ref 3.5–5.1)
SODIUM: 140 mmol/L (ref 135–145)

## 2018-06-12 LAB — CBC WITH DIFFERENTIAL/PLATELET
Abs Immature Granulocytes: 0.03 10*3/uL (ref 0.00–0.07)
Basophils Absolute: 0 10*3/uL (ref 0.0–0.1)
Basophils Relative: 1 %
Eosinophils Absolute: 0.3 10*3/uL (ref 0.0–0.5)
Eosinophils Relative: 5 %
HCT: 37.2 % (ref 36.0–46.0)
Hemoglobin: 11.6 g/dL — ABNORMAL LOW (ref 12.0–15.0)
Immature Granulocytes: 1 %
Lymphocytes Relative: 25 %
Lymphs Abs: 1.3 10*3/uL (ref 0.7–4.0)
MCH: 28.7 pg (ref 26.0–34.0)
MCHC: 31.2 g/dL (ref 30.0–36.0)
MCV: 92.1 fL (ref 80.0–100.0)
Monocytes Absolute: 0.7 10*3/uL (ref 0.1–1.0)
Monocytes Relative: 14 %
NEUTROS PCT: 54 %
Neutro Abs: 2.8 10*3/uL (ref 1.7–7.7)
Platelets: 226 10*3/uL (ref 150–400)
RBC: 4.04 MIL/uL (ref 3.87–5.11)
RDW: 14.9 % (ref 11.5–15.5)
WBC: 5.2 10*3/uL (ref 4.0–10.5)
nRBC: 0 % (ref 0.0–0.2)

## 2018-06-12 LAB — GLUCOSE, CAPILLARY: Glucose-Capillary: 99 mg/dL (ref 70–99)

## 2018-06-12 LAB — HEMOGLOBIN A1C
HEMOGLOBIN A1C: 6.5 % — AB (ref 4.8–5.6)
MEAN PLASMA GLUCOSE: 139.85 mg/dL

## 2018-06-12 MED ORDER — POTASSIUM CHLORIDE CRYS ER 20 MEQ PO TBCR
40.0000 meq | EXTENDED_RELEASE_TABLET | ORAL | Status: AC
  Administered 2018-06-12: 40 meq via ORAL
  Filled 2018-06-12: qty 2

## 2018-06-12 MED ORDER — METOPROLOL TARTRATE 50 MG PO TABS: 75.0000 mg | ORAL_TABLET | Freq: Two times a day (BID) | ORAL | Status: DC

## 2018-06-12 MED ORDER — METFORMIN HCL 1000 MG PO TABS: 1000.0000 mg | ORAL_TABLET | Freq: Two times a day (BID) | ORAL | 0 refills | Status: AC

## 2018-06-12 MED ORDER — FUROSEMIDE 10 MG/ML IJ SOLN
40.0000 mg | Freq: Two times a day (BID) | INTRAMUSCULAR | Status: DC
  Administered 2018-06-12: 40 mg via INTRAVENOUS
  Filled 2018-06-12: qty 4

## 2018-06-12 NOTE — Progress Notes (Signed)
Call placed to CCMD to notify of telemetry monitoring d/c.   

## 2018-06-12 NOTE — Care Management Obs Status (Signed)
MEDICARE OBSERVATION STATUS NOTIFICATION   Patient Details  Name: Melinda Mann MRN: [id number] Date of Birth: 1940-06-24   Medicare Observation Status Notification Given:  Yes    Lawerance Sabal, RN 06/12/2018, 10:00 AM

## 2018-06-12 NOTE — Progress Notes (Signed)
Patient sleeping during shift report.      

## 2018-06-12 NOTE — Discharge Summary (Signed)
Melinda Mann, is a 78 y.o. female  DOB 1940/11/22  MRN [id number].  Admission date:  06/10/2018  Admitting Physician  Lorretta HarpXilin Niu, MD  Discharge Date:  06/12/2018   Primary MD  Irena Reichmannollins, Dana, DO  Recommendations for primary care physician for things to follow:     Discharge Diagnosis    Principal Problem:   Acute on chronic combined systolic and diastolic CHF (congestive heart failure) (HCC) Active Problems:   History of TIA (transient ischemic attack)   Acute respiratory failure with hypoxia (HCC)   Diabetes mellitus without complication (HCC)   Depression with anxiety   HTN (hypertension)      Past Medical History:  Diagnosis Date  . Diabetes mellitus without complication (HCC)   . Hypertension   . TIA (transient ischemic attack)     Past Surgical History:  Procedure Laterality Date  . LEFT HEART CATH AND CORONARY ANGIOGRAPHY N/A 05/17/2018   Procedure: LEFT HEART CATH AND CORONARY ANGIOGRAPHY;  Surgeon: Corky CraftsVaranasi, Jayadeep S, MD;  Location: [facility];  Service: Cardiovascular;  Laterality: N/A;       HPI  from the history and physical done on the day of admission:    Melinda Mann is a 78 y.o. female with medical history significant of hypertension, diabetes mellitus, TIA, depression, anxiety, CHF with EF of 40%, sciatica, who presents with shortness of breath.  Patient states that she has been having shortness breath for almost 6 weeks, which has worsened in the past several days.  She has dry cough, but no chest pain, fever or chills.  She was found to have oxygen desaturation to 80%.  Patient states that she had one loose stool diarrhea recently, currently no nausea, vomiting, diarrhea or abdominal pain.  No symptoms of UTI.  No unilateral weakness.  ED Course: pt was found to have BNP 717, positive d-dimer 3.63, negative troponin, WBC 10.8, creatinine 1.11, BUN 18, temperature  normal, bradycardia, chest x-ray showed cardiomegaly.  CT angiogram is negative for PE, but showed pulmonary edema and bilateral pleural effusion, also showed acute to subacute right lateral mildly displaced 5 and 6 rib fractures. Pt is placed on telemetry bed for observation.    Hospital Course:     1.  Respiratory failure with hypoxia 2/2 chronic combined systolic and diastolic CHF: Complaint of progressively worsening shortness of breath and intermittent leg swelling.  Patient still has crackles on physical exam.  No viral testing was obtained.  BNP 714.7.  D-dimer was noted to be elevated and CT angiogram of the chest noting interstitial edema with pleural effusions.  She had just been recently hospitalized and had undergone left heart catheterization on 05/17/2018 which noted nonobstructive coronary artery disease.  Last echocardiogram on 05/16/2018, noted EF of 40 - 45% with grade 1 diastolic dysfunction.  Patient was set up to see Dr. Tresa EndoKelly of cardiology, but missed the appointment due to passing of her sister.  She had been placed on IV Lasix of 40 mg twice daily with adequate  diuresis.  Initially on 2 L of nasal cannula oxygen, but was able to be weaned to room air.  Admission weight 185.4 pounds and discharge weight 179.7 pounds. Patient was advised to restarted on home dose spironolactone and hydrochlorothiazide.  She will need to call and reschedule outpatient follow-up with Dr. Tresa EndoKelly of cardiology.  2.  Leukocytosis: resolved. Mildly elevated WBC of 10.8 noted on admission.  No other significant signs of infection noted.  Question possibility of a viral illness.  3.  Renal insufficiency: Resolving.  Patient's baseline creatinine previously have been noted to be within normal limits.  On admission creatinine elevated to 1.11.  With diuresis creatinine trending downward to 1.01 prior to discharge  4.  Diabetes mellitus type 2: Hemoglobin A1c noted to be 6.5.  Patient reports that she recently  ran out of medications of metformin 2 weeks ago.  She reports being on metformin several years although it appears not to have never been documented.  Blood sugars initially in the 200s, but improved once placed on sensitive sliding scale insulin.  Patient was sent a prescription for refill of metformin thousand milligrams twice daily.  PCP to follow-up on continuation of this medicine.  5.  Hypokalemia: Acute.  Prior to discharge potassium noted to be 3.3.  Patient was given 40 mEq of potassium chloride.  Patient will need repeat BMP at follow-up appointment with provider.  No supplementation given and she will restart spironolactone.  6.  Rib fractures secondary to falls: Subacute.  Patient reports recently having more falls.  Symptoms can occur when she just turns her head.  She reports having cane and walker at home, but does not regularly use them.  Physical therapy evaluated the patient but did not feel that she required any home physical therapy.  7.  Rheumatoid arthritis: Chronic.  Patient was continued on leflunomide.  Patient to discuss continuation of meloxicam with her PCP.  8.  Anxiety and depression: Stable patient was continued on Ativan and Lexapro as needed  9.  Elevated d-dimer acute.  D-dimer noted to be 3.63.  CT angiogram of the chest negative for pulmonary embolus.  Doppler ultrasound of bilateral lower extremities negative for signs of blood clot.  10.  History of TIA: Patient was initially placed on aspirin, but does not appear part of her home medication.  Primary care provider to continue aspirin if recommended.   Follow UP     Consults obtained: Physical therapy  Discharge Condition: Stable  Diet and Activity recommendation: See Discharge Instructions below  Discharge Instructions    Discharge instructions   Complete by:  As directed    Follow with Primary MD Irena Reichmannollins, Dana, DO in 1 week, and called to reset up appointment with Dr. Tresa EndoKelly of cardiology within 1 to  2 weeks.   Get CBC and BMP -  checked  by Primary MD or SNF MD in 5-7 days ( we routinely change or add medications that can affect your baseline labs and fluid status, therefore we recommend that you get the mentioned basic workup next visit with your PCP, your PCP may decide not to get them or add new tests based on their clinical decision)  Activity: As tolerated with fall precautions  Disposition Home         Diet: heart healthy/carb modified          For Heart failure patients - Check your Weight same time everyday, if you gain over 2 pounds, or you develop in leg swelling,  experience more shortness of breath or chest pain, call your Primary doctor immediately. Follow Cardiac Low Salt Diet and 1.5 lit/day fluid restriction.  Special Instructions: If you have smoked or chewed Tobacco  in the last 2 yrs please stop smoking, stop any regular Alcohol  and or any Recreational drug use.  On your next visit with your primary care physician please Get Medicines reviewed and adjusted.  Please request your Irena Reichmann, DO to go over all Hospital Tests and Procedure/Radiological results at the follow up, please get all Hospital records sent to your Prim MD by signing hospital release before you go home.  If you experience worsening of your admission symptoms, develop shortness of breath, life threatening emergency, suicidal or homicidal thoughts you must seek medical attention immediately by calling 911 or calling your MD immediately  if symptoms less severe.  You Must read complete instructions/literature along with all the possible adverse reactions/side effects for all the Medicines you take and that have been prescribed to you. Take any new Medicines after you have completely understood and accpet all the possible adverse reactions/side effects.   Do not drive, operate heavy machinery, perform activities at heights, swimming or participation in water activities or provide baby sitting  services if your were admitted for syncope or siezures until you have seen by Primary MD or a Neurologist and advised to do so again.  Do not drive when taking Pain medications.  Do not take more than prescribed Pain, Sleep and Anxiety Medications  Wear Seat belts while driving.   Please note  You were cared for by a hospitalist during your hospital stay. If you have any questions about your discharge medications or the care you received while you were in the hospital after you are discharged, you can call the unit and asked to speak with the hospitalist on call if the hospitalist that took care of you is not available. Once you are discharged, your primary care physician will handle any further medical issues. Please note that NO REFILLS for any discharge medications will be authorized once you are discharged, as it is imperative that you return to your primary care physician (or establish a relationship with a primary care physician if you do not have one) for your aftercare needs so that they can reassess your need for medications and monitor your lab values.   Heart Failure patients record your daily weight using the same scale at the same time of day   Complete by:  As directed    Increase activity slowly   Complete by:  As directed    STOP any activity that causes chest pain, shortness of breath, dizziness, sweating, or exessive weakness   Complete by:  As directed         Discharge Medications     Allergies as of 06/12/2018   No Known Allergies     Medication List    TAKE these medications   escitalopram 10 MG tablet Commonly known as:  LEXAPRO Take 10 mg by mouth daily.   fluticasone 50 MCG/ACT nasal spray Commonly known as:  FLONASE 1 to 2 sprays each nostril as needed   folic acid 1 MG tablet Commonly known as:  FOLVITE Take 1 mg by mouth daily.   GARLIC PO Take 2 capsules by mouth daily.   hydrochlorothiazide 12.5 MG tablet Commonly known as:  HYDRODIURIL Take  2 tablets (25 mg total) by mouth daily.   leflunomide 10 MG tablet Commonly known as:  ARAVA  Take 10 mg by mouth daily.   LORazepam 1 MG tablet Commonly known as:  ATIVAN Take 1 mg by mouth 2 (two) times daily as needed for anxiety.   meloxicam 15 MG tablet Commonly known as:  MOBIC Take 15 mg by mouth daily.   metFORMIN 1000 MG tablet Commonly known as:  GLUCOPHAGE Take 1 tablet (1,000 mg total) by mouth 2 (two) times daily with a meal.   methotrexate 2.5 MG tablet Commonly known as:  RHEUMATREX Take 2.5 mg by mouth every Tuesday.   metoprolol tartrate 50 MG tablet Commonly known as:  LOPRESSOR Take 1.5 tablets (75 mg total) by mouth 2 (two) times daily for 30 days.   multivitamin with minerals Tabs tablet Take 1 tablet by mouth daily.   spironolactone 25 MG tablet Commonly known as:  ALDACTONE Take 0.5 tablets (12.5 mg total) by mouth daily.   valsartan 320 MG tablet Commonly known as:  DIOVAN Take 320 mg by mouth daily.   Vitamin D (Ergocalciferol) 1.25 MG (50000 UT) Caps capsule Commonly known as:  DRISDOL Take 1 capsule (50,000 Units total) by mouth every 7 (seven) days.       Major procedures and Radiology Reports - PLEASE review detailed and final reports for all details, in brief -     Dg Chest 2 View  Result Date: 05/15/2018 CLINICAL DATA:  Syncope EXAM: CHEST - 2 VIEW COMPARISON:  12/05/2017 FINDINGS: 1259 hours. The cardio pericardial silhouette is enlarged. Pulmonary vascular congestion with diffuse alveolar opacity suggests edema. There is more focal right base atelectasis or infiltrate. The visualized bony structures of the thorax are intact. Telemetry leads overlie the chest. IMPRESSION: 1. Cardiomegaly with diffuse pulmonary edema pattern. 2. Right base atelectasis or infiltrate. Electronically Signed   By: Kennith Center M.D.   On: 05/15/2018 13:48   Dg Lumbar Spine 2-3 Views  Result Date: 05/16/2018 CLINICAL DATA:  Low back pain and right-sided  sciatica after fall at home yesterday. EXAM: LUMBAR SPINE - 2-3 VIEW COMPARISON:  Lumbar spine radiographs-05/13/2018 FINDINGS: There are 5 non rib-bearing lumbar type vertebral bodies Mild scoliotic curvature the thoracolumbar spine with dominant caudal component convex the right measuring approximately 6 degrees (as measured from the superior endplate of L1 to the inferior endplate of L4). There is mild straightening expected lumbar lordosis. No anterolisthesis or retrolisthesis. Lumbar vertebral body heights appear preserved Mild multilevel lumbar spine DDD, worse at L2-L3 and L4-L5 with disc space height loss, endplate irregularity and sclerosis. Stigmata of DISH within the lower thoracic spine Limited visualization the bilateral SI joints and hips is normal. Regional bowel gas pattern and soft tissues are normal. IMPRESSION: 1. No acute findings. 2. Mild multilevel lumbar spine DDD, worse at L2-L3 and L4-L5. Electronically Signed   By: Simonne Come M.D.   On: 05/16/2018 12:06   Ct Head Wo Contrast  Result Date: 05/15/2018 CLINICAL DATA:  Head injury after fall. EXAM: CT HEAD WITHOUT CONTRAST TECHNIQUE: Contiguous axial images were obtained from the base of the skull through the vertex without intravenous contrast. COMPARISON:  CT scan of May 07, 2013. FINDINGS: Brain: Mild diffuse cortical atrophy. 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 No mass effect or midline shift is noted. Ventricular size is within normal limits. There is no evidence of mass lesion, hemorrhage or acute infarction. Vascular: No hyperdense vessel or unexpected calcification. Skull: Normal. Negative for fracture or focal lesion. Sinuses/Orbits: Left maxillary mucous retention cyst is noted. Other: None. IMPRESSION: Mild  diffuse cortical atrophy. No acute intracranial abnormality seen. Electronically Signed   By: Lupita Raider, M.D.   On: 05/15/2018 12:45   Ct Angio Chest Pe W And/or Wo Contrast  Result  Date: 06/10/2018 CLINICAL DATA:  78 y/o F; 78 y/o F; 6 weeks of shortness of breath. EXAM: CT ANGIOGRAPHY CHEST WITH CONTRAST TECHNIQUE: Multidetector CT imaging of the chest was performed using the standard protocol during bolus administration of intravenous contrast. Multiplanar CT image reconstructions and MIPs were obtained to evaluate the vascular anatomy. CONTRAST:  63 cc Isovue 370 COMPARISON:  07/23/2014 CT chest. FINDINGS: Cardiovascular: Mild aortic and coronary artery calcific atherosclerosis. Normal caliber aorta and main pulmonary artery. Mild cardiomegaly. No pericardial effusion. Satisfactory opacification of the pulmonary arteries. No pulmonary embolus. Mediastinum/Nodes: No enlarged mediastinal, hilar, or axillary lymph nodes. Thyroid gland, trachea, and esophagus demonstrate no significant findings. Lungs/Pleura: Smooth interlobular septal thickening, central ground-glass opacities, peribronchial thickening, moderate right pleural effusion, small left pleural effusion. No consolidation or pneumothorax. Upper Abdomen: Partially visualized exophytic cyst of the right kidney measuring up to 3.8 cm. Small hiatal hernia. Musculoskeletal: Right 5-6 lateral rib mildly displaced fractures. Review of the MIP images confirms the above findings. IMPRESSION: 1. Interstitial and mild alveolar pulmonary edema. Moderate right and small left pleural effusions. 2. Mild cardiomegaly. 3. Mild coronary artery calcific atherosclerosis and Aortic Atherosclerosis (ICD10-I70.0). 4. Acute to subacute right lateral mildly displaced 5 and 6 rib fractures. Electronically Signed   By: Mitzi Hansen M.D.   On: 06/10/2018 22:34   Dg Chest Portable 1 View  Result Date: 06/10/2018 CLINICAL DATA:  Shortness of breath. EXAM: PORTABLE CHEST 1 VIEW COMPARISON:  05/15/2018. FINDINGS: Stable mild enlargement of the cardiac silhouette. Small amount of airspace opacity at the right lateral lung base with improvement. Probable  minimal right pleural effusion with improvement. Mildly prominent interstitial markings with improvement. Diffuse osteopenia, thoracic spine degenerative changes and bilateral shoulder degenerative changes. IMPRESSION: 1. Improving changes of congestive heart failure with stable cardiomegaly. 2. Improved right basilar atelectasis, alveolar edema or pneumonia with minimal residual laterally. Electronically Signed   By: Beckie Salts M.D.   On: 06/10/2018 20:29   Dg Hip Unilat With Pelvis 1v Right  Result Date: 05/16/2018 CLINICAL DATA:  Post fall, now with back and right-sided sciatic pain. EXAM: DG HIP (WITH OR WITHOUT PELVIS) 1V RIGHT COMPARISON:  05/13/2018 FINDINGS: No fracture or dislocation. Mild degenerative change of the right hip with joint space loss, subchondral sclerosis and osteophytosis. No evidence of avascular necrosis. Limited visualization of the pelvis is normal. Similar mild degenerative change of the contralateral left hip is suspected though incompletely evaluated. Suspect degenerative change the lower lumbar spine is suspected though incompletely evaluated Punctate phleboliths overlie the lower pelvis bilaterally. Regional soft tissues appear otherwise normal. IMPRESSION: 1. No acute findings. 2. Mild degenerative change of the right hip. Electronically Signed   By: Simonne Come M.D.   On: 05/16/2018 12:08   Vas [place] Lower Extremity Venous (dvt)  Result Date: 06/11/2018  Lower Venous Study Indications: Positive d dimer.  Performing Technologist: Blanch Media RVS  Examination Guidelines: A complete evaluation includes B-mode imaging, spectral Doppler, color Doppler, and power Doppler as needed of all accessible portions of each vessel. Bilateral testing is considered an integral part of a complete examination. Limited examinations for reoccurring indications may be performed as noted.  Right Venous Findings: +---------+---------------+---------+-----------+----------+-------+           CompressibilityPhasicitySpontaneityPropertiesSummary +---------+---------------+---------+-----------+----------+-------+ CFV  Full           Yes      Yes                          +---------+---------------+---------+-----------+----------+-------+ SFJ      Full                                                 +---------+---------------+---------+-----------+----------+-------+ FV Prox  Full                                                 +---------+---------------+---------+-----------+----------+-------+ FV Mid   Full                                                 +---------+---------------+---------+-----------+----------+-------+ FV DistalFull                                                 +---------+---------------+---------+-----------+----------+-------+ PFV      Full                                                 +---------+---------------+---------+-----------+----------+-------+ POP      Full           Yes      Yes                          +---------+---------------+---------+-----------+----------+-------+ PTV      Full                                                 +---------+---------------+---------+-----------+----------+-------+ PERO     Full                                                 +---------+---------------+---------+-----------+----------+-------+  Left Venous Findings: +---------+---------------+---------+-----------+----------+-------+          CompressibilityPhasicitySpontaneityPropertiesSummary +---------+---------------+---------+-----------+----------+-------+ CFV      Full           Yes      Yes                          +---------+---------------+---------+-----------+----------+-------+ SFJ      Full                                                 +---------+---------------+---------+-----------+----------+-------+ FV Prox  Full                                                  +---------+---------------+---------+-----------+----------+-------+ FV Mid   Full                                                 +---------+---------------+---------+-----------+----------+-------+ FV DistalFull                                                 +---------+---------------+---------+-----------+----------+-------+ PFV      Full                                                 +---------+---------------+---------+-----------+----------+-------+ POP      Full           Yes      Yes                          +---------+---------------+---------+-----------+----------+-------+ PTV      Full                                                 +---------+---------------+---------+-----------+----------+-------+ PERO     Full                                                 +---------+---------------+---------+-----------+----------+-------+    Summary: Right: There is no evidence of deep vein thrombosis in the lower extremity. No cystic structure found in the popliteal fossa. Left: There is no evidence of deep vein thrombosis in the lower extremity. No cystic structure found in the popliteal fossa.  *See table(s) above for measurements and observations. Electronically signed by Sherald Hess MD on 06/11/2018 at 10:27:00 AM.    Final     Micro Results    No results found for this or any previous visit (from the past 240 hour(s)).     Today   Subjective    Melinda Mann states she feels better today and her breathing is much improved.  She still has a mild cough, but states it seems to be getting better.  Objective   Blood pressure (!) 169/58, pulse (!) 54, temperature 97.9 F (36.6 C), temperature source Oral, resp. rate 18, height 5\' 6"  (1.676 m), weight 81.5 kg, SpO2 97 %.   Intake/Output Summary (Last 24 hours) at 06/12/2018 1254 Last data filed at 06/12/2018 1039 Gross per 24 hour  Intake 813 ml  Output 1850 ml  Net -1037 ml     Exam  Constitutional: Elderly female NAD, calm, comfortable Eyes: PERRL, lids and conjunctivae normal ENMT: Mucous membranes are moist. Posterior pharynx clear of any exudate or lesions.  Neck: normal, supple, no masses, no thyromegaly Respiratory: clear to auscultation bilaterally, no wheezing, no crackles. Normal respiratory effort. No accessory muscle use.  Able to talk in complete sentences on room air. Cardiovascular: Regular rate and rhythm, no murmurs / rubs / gallops. No extremity edema. 2+ pedal pulses. No carotid bruits.  Abdomen: no tenderness, no masses palpated. No hepatosplenomegaly. Bowel sounds positive.  Musculoskeletal: no clubbing / cyanosis. No joint deformity upper and lower extremities. Good ROM, no contractures. Normal muscle tone.  Skin: no rashes, lesions, ulcers. No induration Neurologic: CN 2-12 grossly intact. Sensation intact, DTR normal. Strength 5/5 in all 4.  Psychiatric: Normal judgment and insight. Alert and oriented x 3. Normal mood.    Data Review   CBC w Diff:  Lab Results  Component Value Date   WBC 5.2 06/12/2018   HGB 11.6 (L) 06/12/2018   HGB 11.4 03/14/2018   HCT 37.2 06/12/2018   HCT 33.9 (L) 03/14/2018   PLT 226 06/12/2018   PLT 283 03/14/2018   LYMPHOPCT 25 06/12/2018   MONOPCT 14 06/12/2018   EOSPCT 5 06/12/2018   BASOPCT 1 06/12/2018    CMP:  Lab Results  Component Value Date   NA 140 06/12/2018   NA 144 03/14/2018   K 3.3 (L) 06/12/2018   CL 99 06/12/2018   CO2 28 06/12/2018   BUN 18 06/12/2018   BUN 10 03/14/2018   CREATININE 1.01 (H) 06/12/2018   PROT 7.0 06/10/2018   PROT 6.6 03/14/2018   ALBUMIN 3.7 06/10/2018   ALBUMIN 4.1 03/14/2018   BILITOT 0.4 06/10/2018   BILITOT 0.4 03/14/2018   ALKPHOS 80 06/10/2018   AST 22 06/10/2018   ALT 19 06/10/2018  .   Total Time in preparing paper work, data evaluation and todays exam - 35 minutes  Clydie Braun M.D on 06/12/2018 at 12:54 PM  [facility]    Office  [phone]

## 2018-06-13 DIAGNOSIS — E876 Hypokalemia: Secondary | ICD-10-CM | POA: Diagnosis present

## 2018-07-18 ENCOUNTER — Other Ambulatory Visit: Payer: Self-pay

## 2018-07-18 ENCOUNTER — Encounter: Payer: Self-pay | Admitting: Cardiovascular Disease

## 2018-07-18 ENCOUNTER — Ambulatory Visit: Payer: Medicare Other | Admitting: Cardiovascular Disease

## 2018-07-18 VITALS — BP 156/76 | HR 76 | Ht 66.0 in | Wt 184.6 lb

## 2018-07-18 DIAGNOSIS — I5189 Other ill-defined heart diseases: Secondary | ICD-10-CM

## 2018-07-18 DIAGNOSIS — I519 Heart disease, unspecified: Secondary | ICD-10-CM

## 2018-07-18 DIAGNOSIS — M25473 Effusion, unspecified ankle: Secondary | ICD-10-CM

## 2018-07-18 DIAGNOSIS — I2510 Atherosclerotic heart disease of native coronary artery without angina pectoris: Secondary | ICD-10-CM

## 2018-07-18 DIAGNOSIS — I10 Essential (primary) hypertension: Secondary | ICD-10-CM

## 2018-07-18 DIAGNOSIS — I493 Ventricular premature depolarization: Secondary | ICD-10-CM | POA: Diagnosis not present

## 2018-07-18 DIAGNOSIS — R0602 Shortness of breath: Secondary | ICD-10-CM

## 2018-07-18 MED ORDER — METOPROLOL SUCCINATE ER 100 MG PO TB24: 100.0000 mg | ORAL_TABLET | Freq: Every day | ORAL | 1 refills | Status: AC

## 2018-07-18 MED ORDER — SPIRONOLACTONE 25 MG PO TABS: 12.5000 mg | ORAL_TABLET | Freq: Two times a day (BID) | ORAL | 3 refills | Status: AC

## 2018-07-18 NOTE — Patient Instructions (Addendum)
Medication Instructions:  Stop taking Metoprolol tartrate Start Metoprolol Succinate 100 mg daily. Start Spironolactone 12.5 twice daily.  If you need a refill on your cardiac medications before your next appointment, please call your pharmacy.   Follow-Up: At [facility], you and your health needs are our priority.  As part of our continuing mission to provide you with exceptional heart care, we have created designated Provider Care Teams.  These Care Teams include your primary Cardiologist (physician) and Advanced Practice Providers (APPs -  Physician Assistants and Nurse Practitioners) who all work together to provide you with the care you need, when you need it. You will need a follow up appointment in 3 months.  You may see Nicki Guadalajara, MD or one of the following Advanced Practice Providers on your designated Care Team: [place], [place] . Micah Flesher, PA-C

## 2018-07-18 NOTE — Progress Notes (Signed)
Cardiology Office Note    Date:  07/20/2018   ID:  [name], [name] [birth year], MRN [id number]  PCP:  Melinda Morning, DO  Cardiologist:  Melinda Majestic, MD   F/U cardiology evaluation referred through the courtesy of Dr. Maudie Mann for evaluation of recent chest pain and emergency room visit.  History of Present Illness:  Melinda Mann is a 78 y.o. female who was evaluated in the ER for chest pain and was referred by Dr. Maudie Mann for cardiology evaluation.  I saw her for initial evaluation on February 28, 2018 and last saw her in December 2019.  She presents for four-month follow-up evaluation.   Ms. Melinda Mann has a history of hypertension for at least 30 years.  I had taken care of her husband for many years Melinda Mann) passed away approximately 5 years ago.  Patient denies any history of documented coronary artery disease.  In August 2019, she was evaluated in the [facility] emergency room with complaints of chest pain.  At that time she had noticed that she was having some pain with swallowing and had a mild cough.  She admitted to abdominal bloating.  Her chest pain was nonexertional.  Her ECG during that evaluation showed sinus tachycardia with frequent PVCs with a ventricular rate of 128 bpm.  Troponins were negative.  She was seen in follow-up at [facility] and was referred to me for further cardiology evaluation.  The patient states that she has had pain in her right upper quadrant for over 4 years but ultimately this has improved.  She denied exertional chest pain symptomatology.  She had  some episodes of shortness of breath.  She occasionally walks but not for long duration and denies exertional precipitation.  She denied presyncope or syncope,  PND orthopnea.  She has been on Benicar HCT 40/25 mg metoprolol 25 mg twice a day and hydrochlorothiazide for hypertension.  She also has a history of depression and is on Lexapro.   When I initially saw her, I titrated her metoprolol to 50 mg  twice a day as her blood pressure was elevated by me at 170/86 she was having frequent PVCs with transient bigeminal rhythm.  I scheduled her for an echo Doppler study which was done on March 18, 2018 and showed an EF of 60 to 65%.  There was grade 1 diastolic dysfunction.  Doppler parameters were consistent with high ventricular filling pressure.  There was moderate aortic insufficiency.  There was mild increased PA pressure 35 mm.  Her palpitations have improved with her increased beta-blocker regimen.  She underwent laboratory which showed a total cholesterol 107, triglycerides 79, HDL 40, LDL 51.  She has noticed some trace to mild ankle edema.  She has been on hydrochlorothiazide 12.5 mg daily, valsartan 320 mg daily, and metoprolol 50 mg twice a day.    I saw her on April 04, 2018, her blood pressure was improved but still elevated and her resting pulse is in the 90s.  I recommended further titration of metoprolol to 75 mg twice a day and with her diastolic dysfunction and trace edema added spironolactone 12.5 mg for aldosterone blockade.  Since I saw her, she has had 2 hospitalizations with her initial hospitalization on January 12 through May 17, 2018.  At that time she presented to the emergency room with unsteady gait multiple falls and worsening shortness of breath.  CT of the head was negative.  No ECG changes were noted.  A repeat echo was done which showed reduced EF at 40 to 45% during that evaluation she ultimately had a cardiac catheterization and was not felt to have significant obstructive CAD.  Her medications were adjusted.  She was re-admitted to the hospital on June 10, 2018 and discharged on June 12, 2018 with increasing shortness of breath.  A CT angiogram was negative for PE but suggested pulmonary edema and bilateral pleural effusions and also showed acute to subacute right lateral mildly displaced 5 and 6 rib fractures.  Since her last hospitalization, she is now been  on atorvastatin 40 mg for hyperlipidemia.  She is on HCTZ 25 mg, metoprolol 75 mg twice a day, spironolactone 12.5 mg, valsartan 320 mg for hypertension.  She is on metformin for diabetes mellitus.  She has rheumatoid arthritis and is on leflunomide.  She presents for reevaluation.  Past Medical History:  Diagnosis Date   Diabetes mellitus without complication ([place])    Hypertension    TIA (transient ischemic attack)     Past Surgical History:  Procedure Laterality Date   LEFT HEART CATH AND CORONARY ANGIOGRAPHY N/A 05/17/2018   Procedure: LEFT HEART CATH AND CORONARY ANGIOGRAPHY;  Surgeon: Jettie Booze, MD;  Location: [facility];  Service: Cardiovascular;  Laterality: N/A;    Current Medications: Outpatient Medications Prior to Visit  Medication Sig Dispense Refill   atorvastatin (LIPITOR) 40 MG tablet Take 1 tablet by mouth daily.     escitalopram (LEXAPRO) 10 MG tablet Take 10 mg by mouth daily.     fluticasone (FLONASE) 50 MCG/ACT nasal spray 1 to 2 sprays each nostril as needed     folic acid (FOLVITE) 1 MG tablet Take 1 mg by mouth daily.  1   GARLIC PO Take 2 capsules by mouth daily.     hydrochlorothiazide (HYDRODIURIL) 12.5 MG tablet Take 2 tablets (25 mg total) by mouth daily. 30 tablet 2   leflunomide (ARAVA) 10 MG tablet Take 10 mg by mouth daily.     LORazepam (ATIVAN) 1 MG tablet Take 1 mg by mouth 2 (two) times daily as needed for anxiety.      meloxicam (MOBIC) 15 MG tablet Take 15 mg by mouth daily.     metFORMIN (GLUCOPHAGE) 1000 MG tablet Take 1 tablet (1,000 mg total) by mouth 2 (two) times daily with a meal. 60 tablet 0   methotrexate (RHEUMATREX) 2.5 MG tablet Take 2.5 mg by mouth every Tuesday.      Multiple Vitamin (MULTIVITAMIN WITH MINERALS) TABS tablet Take 1 tablet by mouth daily.     valsartan (DIOVAN) 320 MG tablet Take 320 mg by mouth daily.  0   Vitamin D, Ergocalciferol, (DRISDOL) 1.25 MG (50000 UT) CAPS capsule Take 1  capsule (50,000 Units total) by mouth every 7 (seven) days. 8 capsule 0   metoprolol tartrate (LOPRESSOR) 50 MG tablet Take 1.5 tablets (75 mg total) by mouth 2 (two) times daily for 30 days.     spironolactone (ALDACTONE) 25 MG tablet Take 0.5 tablets (12.5 mg total) by mouth daily. 45 tablet 3   No facility-administered medications prior to visit.      Allergies:   Patient has no known allergies.   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 [employer] insecurity:    Worry: Not on file    Inability: Not on file   Transportation needs:    Medical: Not on file    Non-medical: Not on file  Tobacco Use   Smoking status: Never Smoker   Smokeless tobacco: Never Used  Substance and Sexual Activity   Alcohol use: No   Drug use: No   Sexual activity: Not on file  Lifestyle   Physical activity:    Days per week: Not on file    Minutes per session: Not on file   Stress: Not on file  Relationships   Social connections:    Talks on phone: Not on file    Gets together: Not on file    Attends religious service: Not on file    Active member of club or organization: Not on file    Attends meetings of clubs or organizations: Not on file    Relationship status: Not on file  Other Topics Concern   Not on file  Social History Narrative   Not on file    Additional social history is notable in that she is widowed for 5 years.  She lives with her daughter.  She previously was a Secretary/administrator at [employer].  She completed 12th grade.  She is retired.  There is no tobacco or alcohol use.  She does not use illicit drugs.  Family History:  The patient's family history includes Breast cancer in her mother and  sister; Colon cancer in her brother and brother; Heart attack in her sister; Heart disease in her brother and mother; Lung cancer in her sister.   Mother died at age 64 and had heart trouble and a CA.  Father died of natural causes at age 74.  2 brothers died with colon cancer and one sister died with breast and lung cancer.  ROS General: Negative; No fevers, chills, or night sweats;  HEENT: Negative; No changes in vision or hearing, sinus congestion, difficulty swallowing Pulmonary: Negative; No cough, wheezing, shortness of breath, hemoptysis Cardiovascular: Palpitations improved, intermittent ankle swelling GI: 3 of intermittent abdominal pains. GU: Negative; No dysuria, hematuria, or difficulty voiding Musculoskeletal: Negative; no myalgias, joint pain, or weakness Hematologic/Oncology: Negative; no easy bruising, bleeding Endocrine: Negative; no heat/cold intolerance; no diabetes Neuro: Negative; no changes in balance, headaches Skin: Negative; No rashes or skin lesions Psychiatric: Negative; No behavioral problems, depression Sleep: Negative; No snoring, daytime sleepiness, hypersomnolence, bruxism, restless legs, hypnogognic hallucinations, no cataplexy Other comprehensive 14 point system review is negative.   PHYSICAL EXAM:   VS:  BP (!) 156/76    Pulse 76    Ht 5' 6" (1.676 m)    Wt 184 lb 9.6 oz (83.7 kg)    BMI 29.80 kg/m     Repeat blood pressure by me was elevated at 160/78  Wt Readings from Last 3 Encounters:  07/18/18 184 lb 9.6 oz (83.7 kg)  06/12/18 179 lb 11.2 oz (81.5 kg)  05/17/18 185 lb 3.2 oz (84 kg)    General: Alert, oriented, no distress.  Skin: normal turgor, no rashes, warm and dry HEENT: Normocephalic, atraumatic. Pupils equal round and reactive to light; sclera anicteric; extraocular muscles intact;  Nose without nasal septal hypertrophy Mouth/Parynx benign; Mallinpatti scale 3 Neck: No JVD, no carotid bruits; normal carotid upstroke Lungs: clear to  ausculatation and percussion; no wheezing or rales Chest wall: without  tenderness to palpitation Heart: PMI not displaced, a trigeminal rhythm on prolonged auscultation, s1 s2 normal, 1/6 systolic murmur, no diastolic murmur, no rubs, gallops, thrills, or heaves Abdomen: soft, nontender; no hepatosplenomehaly, BS+; abdominal aorta nontender and not dilated by palpation. Back: no CVA tenderness Pulses 2+ Musculoskeletal: full range of motion, normal strength, no joint deformities Extremities: Trace ankle edema; no clubbing cyanosis, Homan's sign negative  Neurologic: grossly nonfocal; Cranial nerves grossly wnl Psychologic: Normal mood and affect   Studies/Labs Reviewed:   EKG:  EKG is ordered today.  ECG (independently read by me): Sinus rhythm with occasional unifocal PVCs with right bundle branch morphology.  Nonspecific ST-T changes.  QTc interval 474 ms.  April 04, 2018 ECG (independently read by me): Normal sinus rhythm at 91 bpm.  First-degree AV block with a PR interval of 204 ms.  Increased QTc interval at 496 ms.  Occasional PVC  February 28, 2018 ECG (independently read by me): Sinus rhythm at 98 bpm with frequent PVCs and a transient by bigeminal rhythm.  Poor anterior R wave progression.  PVCs have a right bundle branch block morphology.  PR interval 188 ms, QTc interval 497 ms.  Recent Labs: BMP Latest Ref Rng & Units 06/12/2018 06/10/2018 05/15/2018  Glucose 70 - 99 mg/dL 116(H) 150(H) 236(H)  BUN 8 - 23 mg/dL _0 Creatinine 0.44 - 1.00 mg/dL 1.01(H) 1.11(H) 1.06(H)  BUN/Creat Ratio 12 - 28 - - -  Sodium 135 - 145 mmol/L 140 140 134(L)  Potassium 3.5 - 5.1 mmol/L 3.3(L) 4.3 4.6  Chloride 98 - 111 mmol/L 99 105 102  CO2 22 - 32 mmol/L 28 22 19(L)  Calcium 8.9 - 10.3 mg/dL 9.3 9.4 9.3     Hepatic Function Latest Ref Rng & Units 06/10/2018 05/15/2018 03/14/2018  Total Protein 6.5 - 8.1 g/dL 7.0 6.7 6.6  Albumin 3.5 - 5.0 g/dL 3.7 3.7 4.1  AST 15 - 41 U/L 22 32 29  ALT  0 - 44 U/L _1 Alk Phosphatase 38 - 126 U/L 80 78 96  Total Bilirubin 0.3 - 1.2 mg/dL 0.4 1.1 0.4    CBC Latest Ref Rng & Units 06/12/2018 06/10/2018 05/15/2018  WBC 4.0 - 10.5 K/uL 5.2 10.8(H) 9.9  Hemoglobin 12.0 - 15.0 g/dL 11.6(L) 11.9(L) 12.7  Hematocrit 36.0 - 46.0 % 37.2 39.5 41.2  Platelets 150 - 400 K/uL 226 243 258   Lab Results  Component Value Date   MCV 92.1 06/12/2018   MCV 95.6 06/10/2018   MCV 95.6 05/15/2018   Lab Results  Component Value Date   TSH 2.580 03/14/2018   Lab Results  Component Value Date   HGBA1C 6.5 (H) 06/12/2018     BNP    Component Value Date/Time   BNP 714.7 (H) 06/10/2018 2000    ProBNP No results found for: PROBNP   Lipid Panel     Component Value Date/Time   CHOL 107 03/14/2018 1028   TRIG 79 03/14/2018 1028   HDL 40 03/14/2018 1028   CHOLHDL 2.7 03/14/2018 1028   LDLCALC 51 03/14/2018 1028     RADIOLOGY: No results found.   Additional studies/ records that were reviewed today include:   I reviewed the records from the emergency room as well as [place]. Her hospitalizations from January February 2020 were reviewed.  ASSESSMENT:    1. Essential hypertension   2. PVC's (premature ventricular contractions)   3. Grade I diastolic dysfunction   4. CAD  in native artery; minimal lumen irregularities as cath January 2020   5. SOB (shortness of breath)   6. Ankle edema     PLAN:  Melinda Mann is a 78 year old widowed female who was the wife of my former patient Mr. Melinda Mann.  Melinda Mann has a >30-year history of hypertension and developed some atypical chest pain symptomatology.  She admits to shortness of breath.  She denies any exertional symptoms.  When I initially saw her, her blood pressure was elevated and she was having frequent ventricular ectopy.  I increased her metoprolol to 50 mg twice a day.  This has improved her sense of palpitations.  Her echo Doppler study demonstrated normal systolic  function with grade 1 diastolic dysfunction and abnormal tissue Doppler suggestive of high ventricular filling pressure.  There was mild increased PA pressure 35 mm.  At her follow-up evaluation, her resting pulse was elevated in the 90s and metoprolol was further titrated to 75 mg twice a day and she was started on low-dose Spironolactone 12.5 mg daily.  I reviewed her 2 subsequent hospitalizations.  Apparently with her first hospitalization her metoprolol dose was reduced back to 50 twice a day.  She underwent a cardiac catheterization which did not reveal any significant obstructive stenoses only minimal plaque was noted.  Repeat echo Doppler had shown reduction of LV function with an EF of 40 to 45%.  On her subsequent evaluation a CT was negative for PE but raised the possibility of mild heart failure with mild alveolar pulmonary edema and she was also noted to have moderate right and small left pleural effusions.  Medications were adjusted.  Presently, her blood pressure today is elevated at 160/78.  During prolonged auscultationshe was noted to have a trigeminal rhythm and on ECG was noted to have occasional PVCs.  I am electing to change her metoprolol tartrate from 75 mg twice a day to metoprolol succinate 100 mg daily.  I have also further titrating spironolactone to 12.5 mg twice a day with her documented diastolic dysfunction and recent mild interstitial and pulmonary edema pattern.  I recommended she change her meloxicam from daily to just as needed since this may contribute to some heart failure issues and renal insufficiency.  She will continue her atorvastatin at 40 mg daily and present dose valsartan along with HCTZ.  I will see her in 3 months for reevaluation or sooner if problems arise.   Medication Adjustments/Labs and Tests Ordered: Current medicines are reviewed at length with the patient today.  Concerns regarding medicines are outlined above.  Medication changes, Labs and Tests ordered  today are listed in the Patient Instructions below. Patient Instructions  Medication Instructions:  Stop taking Metoprolol tartrate Start Metoprolol Succinate 100 mg daily. Start Spironolactone 12.5 twice daily.  If you need a refill on your cardiac medications before your next appointment, please call your pharmacy.   Follow-Up: At [facility], you and your health needs are our priority.  As part of our continuing mission to provide you with exceptional heart care, we have created designated Provider Care Teams.  These Care Teams include your primary Cardiologist (physician) and Advanced Practice Providers (APPs -  Physician Assistants and Nurse Practitioners) who all work together to provide you with the care you need, when you need it. You will need a follow up appointment in 3 months.  You may see Melinda Majestic, MD or one of the following Advanced Practice Providers on your designated Care Team: Almyra Deforest,  PA-C  Fabian Sharp, PA-C        Signed, Melinda Majestic, MD  07/20/2018 4:37 PM    [facility] [address] [place], [place], [place]  [address] Phone: [phone]

## 2018-07-20 ENCOUNTER — Encounter: Payer: Self-pay | Admitting: Cardiovascular Disease

## 2018-07-30 ENCOUNTER — Emergency Department (HOSPITAL_COMMUNITY): Payer: Medicare Other

## 2018-07-30 ENCOUNTER — Inpatient Hospital Stay (HOSPITAL_COMMUNITY)
Admission: EM | Admit: 2018-07-30 | Discharge: 2018-09-02 | DRG: 208 | Disposition: E | Payer: Medicare Other | Attending: Emergency Medicine | Admitting: Emergency Medicine

## 2018-07-30 ENCOUNTER — Encounter (HOSPITAL_COMMUNITY): Payer: Self-pay

## 2018-07-30 DIAGNOSIS — I509 Heart failure, unspecified: Secondary | ICD-10-CM

## 2018-07-30 DIAGNOSIS — D72829 Elevated white blood cell count, unspecified: Secondary | ICD-10-CM | POA: Diagnosis present

## 2018-07-30 DIAGNOSIS — E872 Acidosis, unspecified: Secondary | ICD-10-CM | POA: Diagnosis present

## 2018-07-30 DIAGNOSIS — Z801 Family history of malignant neoplasm of trachea, bronchus and lung: Secondary | ICD-10-CM

## 2018-07-30 DIAGNOSIS — D539 Nutritional anemia, unspecified: Secondary | ICD-10-CM | POA: Diagnosis present

## 2018-07-30 DIAGNOSIS — Z8673 Personal history of transient ischemic attack (TIA), and cerebral infarction without residual deficits: Secondary | ICD-10-CM

## 2018-07-30 DIAGNOSIS — J9602 Acute respiratory failure with hypercapnia: Secondary | ICD-10-CM | POA: Diagnosis not present

## 2018-07-30 DIAGNOSIS — Z66 Do not resuscitate: Secondary | ICD-10-CM | POA: Diagnosis not present

## 2018-07-30 DIAGNOSIS — R40243 Glasgow coma scale score 3-8, unspecified time: Secondary | ICD-10-CM | POA: Diagnosis present

## 2018-07-30 DIAGNOSIS — I469 Cardiac arrest, cause unspecified: Secondary | ICD-10-CM

## 2018-07-30 DIAGNOSIS — Z7951 Long term (current) use of inhaled steroids: Secondary | ICD-10-CM

## 2018-07-30 DIAGNOSIS — Z7984 Long term (current) use of oral hypoglycemic drugs: Secondary | ICD-10-CM

## 2018-07-30 DIAGNOSIS — R05 Cough: Secondary | ICD-10-CM | POA: Diagnosis present

## 2018-07-30 DIAGNOSIS — Z800 Family history of malignant neoplasm of digestive organs: Secondary | ICD-10-CM

## 2018-07-30 DIAGNOSIS — Z803 Family history of malignant neoplasm of breast: Secondary | ICD-10-CM

## 2018-07-30 DIAGNOSIS — E119 Type 2 diabetes mellitus without complications: Secondary | ICD-10-CM | POA: Diagnosis present

## 2018-07-30 DIAGNOSIS — J9601 Acute respiratory failure with hypoxia: Secondary | ICD-10-CM | POA: Diagnosis present

## 2018-07-30 DIAGNOSIS — N179 Acute kidney failure, unspecified: Secondary | ICD-10-CM | POA: Diagnosis present

## 2018-07-30 DIAGNOSIS — G931 Anoxic brain damage, not elsewhere classified: Secondary | ICD-10-CM | POA: Diagnosis present

## 2018-07-30 DIAGNOSIS — Z8249 Family history of ischemic heart disease and other diseases of the circulatory system: Secondary | ICD-10-CM

## 2018-07-30 DIAGNOSIS — Z20828 Contact with and (suspected) exposure to other viral communicable diseases: Secondary | ICD-10-CM | POA: Diagnosis present

## 2018-07-30 DIAGNOSIS — Z79899 Other long term (current) drug therapy: Secondary | ICD-10-CM

## 2018-07-30 DIAGNOSIS — L8990 Pressure ulcer of unspecified site, unspecified stage: Secondary | ICD-10-CM | POA: Diagnosis present

## 2018-07-30 DIAGNOSIS — G253 Myoclonus: Secondary | ICD-10-CM | POA: Diagnosis present

## 2018-07-30 DIAGNOSIS — Z789 Other specified health status: Secondary | ICD-10-CM

## 2018-07-30 DIAGNOSIS — Z452 Encounter for adjustment and management of vascular access device: Secondary | ICD-10-CM

## 2018-07-30 DIAGNOSIS — Z1159 Encounter for screening for other viral diseases: Secondary | ICD-10-CM

## 2018-07-30 DIAGNOSIS — Z515 Encounter for palliative care: Secondary | ICD-10-CM | POA: Diagnosis not present

## 2018-07-30 DIAGNOSIS — Z9289 Personal history of other medical treatment: Secondary | ICD-10-CM

## 2018-07-30 DIAGNOSIS — R4020 Unspecified coma: Secondary | ICD-10-CM | POA: Diagnosis present

## 2018-07-30 DIAGNOSIS — E875 Hyperkalemia: Secondary | ICD-10-CM | POA: Diagnosis present

## 2018-07-30 DIAGNOSIS — A09 Infectious gastroenteritis and colitis, unspecified: Secondary | ICD-10-CM | POA: Diagnosis present

## 2018-07-30 DIAGNOSIS — I110 Hypertensive heart disease with heart failure: Secondary | ICD-10-CM | POA: Diagnosis present

## 2018-07-30 DIAGNOSIS — R197 Diarrhea, unspecified: Secondary | ICD-10-CM | POA: Diagnosis present

## 2018-07-30 LAB — POCT I-STAT EG7
Acid-base deficit: 16 mmol/L — ABNORMAL HIGH (ref 0.0–2.0)
Bicarbonate: 15.8 mmol/L — ABNORMAL LOW (ref 20.0–28.0)
Calcium, Ion: 1.1 mmol/L — ABNORMAL LOW (ref 1.15–1.40)
HEMATOCRIT: 36 % (ref 36.0–46.0)
Hemoglobin: 12.2 g/dL (ref 12.0–15.0)
O2 Saturation: 60 %
Potassium: 5.2 mmol/L — ABNORMAL HIGH (ref 3.5–5.1)
Sodium: 142 mmol/L (ref 135–145)
TCO2: 18 mmol/L — ABNORMAL LOW (ref 22–32)
pCO2, Ven: 66.7 mmHg — ABNORMAL HIGH (ref 44.0–60.0)
pH, Ven: 6.982 — CL (ref 7.250–7.430)
pO2, Ven: 48 mmHg — ABNORMAL HIGH (ref 32.0–45.0)

## 2018-07-30 LAB — POCT I-STAT 7, (LYTES, BLD GAS, ICA,H+H)
Acid-base deficit: 17 mmol/L — ABNORMAL HIGH (ref 0.0–2.0)
BICARBONATE: 13.9 mmol/L — AB (ref 20.0–28.0)
Calcium, Ion: 1.2 mmol/L (ref 1.15–1.40)
HCT: 36 % (ref 36.0–46.0)
Hemoglobin: 12.2 g/dL (ref 12.0–15.0)
O2 Saturation: 100 %
Patient temperature: 98.6
Potassium: 5.5 mmol/L — ABNORMAL HIGH (ref 3.5–5.1)
Sodium: 141 mmol/L (ref 135–145)
TCO2: 16 mmol/L — AB (ref 22–32)
pCO2 arterial: 54.7 mmHg — ABNORMAL HIGH (ref 32.0–48.0)
pH, Arterial: 7.013 — CL (ref 7.350–7.450)
pO2, Arterial: 284 mmHg — ABNORMAL HIGH (ref 83.0–108.0)

## 2018-07-30 LAB — I-STAT TROPONIN, ED: Troponin i, poc: 0.04 ng/mL (ref 0.00–0.08)

## 2018-07-30 LAB — CBG MONITORING, ED: Glucose-Capillary: 239 mg/dL — ABNORMAL HIGH (ref 70–99)

## 2018-07-30 LAB — APTT: aPTT: 42 seconds — ABNORMAL HIGH (ref 24–36)

## 2018-07-30 LAB — PROTIME-INR
INR: 1.2 (ref 0.8–1.2)
Prothrombin Time: 14.9 seconds (ref 11.4–15.2)

## 2018-07-30 MED ORDER — LORAZEPAM 2 MG/ML IJ SOLN
2.0000 mg | Freq: Once | INTRAMUSCULAR | Status: AC
  Administered 2018-07-30: 2 mg via INTRAVENOUS

## 2018-07-30 MED ORDER — PROPOFOL 1000 MG/100ML IV EMUL
5.0000 ug/kg/min | INTRAVENOUS | Status: DC
  Administered 2018-07-30: 20 ug/kg/min via INTRAVENOUS

## 2018-07-30 MED ORDER — ATROPINE SULFATE 1 MG/10ML IJ SOSY
PREFILLED_SYRINGE | INTRAMUSCULAR | Status: AC
  Filled 2018-07-30: qty 10

## 2018-07-30 MED ORDER — LORAZEPAM 2 MG/ML IJ SOLN
INTRAMUSCULAR | Status: AC
  Filled 2018-07-30: qty 1

## 2018-07-30 MED ORDER — ONDANSETRON HCL 4 MG/2ML IJ SOLN
INTRAMUSCULAR | Status: AC
  Administered 2018-07-30: 23:00:00
  Filled 2018-07-30: qty 2

## 2018-07-30 MED ORDER — PROPOFOL 1000 MG/100ML IV EMUL
INTRAVENOUS | Status: AC
  Filled 2018-07-30: qty 100

## 2018-07-30 NOTE — ED Triage Notes (Signed)
Pt comes via [facility], witnessed arrest by family, asystole initial rhythm, 5 minutes CPR, given 2 epi, then in junction rhythm in the 40'2, being paced now

## 2018-07-30 NOTE — ED Notes (Addendum)
Pacing stopped, pt in junctional rhythm of the 80's

## 2018-07-30 NOTE — Progress Notes (Signed)
RT reported critical ABG results to MD Ward.  Ph  7.013 PCO2  54.7 PO2  284 HCO3  13.9 SO2  100%

## 2018-07-31 ENCOUNTER — Inpatient Hospital Stay (HOSPITAL_COMMUNITY): Payer: Medicare Other

## 2018-07-31 ENCOUNTER — Emergency Department (HOSPITAL_COMMUNITY): Payer: Medicare Other

## 2018-07-31 DIAGNOSIS — G931 Anoxic brain damage, not elsewhere classified: Secondary | ICD-10-CM | POA: Diagnosis present

## 2018-07-31 DIAGNOSIS — Z20828 Contact with and (suspected) exposure to other viral communicable diseases: Secondary | ICD-10-CM | POA: Diagnosis present

## 2018-07-31 DIAGNOSIS — L8990 Pressure ulcer of unspecified site, unspecified stage: Secondary | ICD-10-CM | POA: Diagnosis present

## 2018-07-31 DIAGNOSIS — I110 Hypertensive heart disease with heart failure: Secondary | ICD-10-CM | POA: Diagnosis present

## 2018-07-31 DIAGNOSIS — R197 Diarrhea, unspecified: Secondary | ICD-10-CM

## 2018-07-31 DIAGNOSIS — D72829 Elevated white blood cell count, unspecified: Secondary | ICD-10-CM | POA: Diagnosis present

## 2018-07-31 DIAGNOSIS — Z7984 Long term (current) use of oral hypoglycemic drugs: Secondary | ICD-10-CM | POA: Diagnosis not present

## 2018-07-31 DIAGNOSIS — E872 Acidosis, unspecified: Secondary | ICD-10-CM | POA: Diagnosis present

## 2018-07-31 DIAGNOSIS — I509 Heart failure, unspecified: Secondary | ICD-10-CM

## 2018-07-31 DIAGNOSIS — Z66 Do not resuscitate: Secondary | ICD-10-CM | POA: Diagnosis not present

## 2018-07-31 DIAGNOSIS — R05 Cough: Secondary | ICD-10-CM | POA: Diagnosis present

## 2018-07-31 DIAGNOSIS — D539 Nutritional anemia, unspecified: Secondary | ICD-10-CM

## 2018-07-31 DIAGNOSIS — R40243 Glasgow coma scale score 3-8, unspecified time: Secondary | ICD-10-CM | POA: Diagnosis present

## 2018-07-31 DIAGNOSIS — E119 Type 2 diabetes mellitus without complications: Secondary | ICD-10-CM | POA: Diagnosis present

## 2018-07-31 DIAGNOSIS — E875 Hyperkalemia: Secondary | ICD-10-CM | POA: Diagnosis present

## 2018-07-31 DIAGNOSIS — J9601 Acute respiratory failure with hypoxia: Secondary | ICD-10-CM | POA: Diagnosis present

## 2018-07-31 DIAGNOSIS — I469 Cardiac arrest, cause unspecified: Secondary | ICD-10-CM | POA: Diagnosis present

## 2018-07-31 DIAGNOSIS — J9602 Acute respiratory failure with hypercapnia: Secondary | ICD-10-CM | POA: Diagnosis present

## 2018-07-31 DIAGNOSIS — R4020 Unspecified coma: Secondary | ICD-10-CM | POA: Diagnosis present

## 2018-07-31 DIAGNOSIS — Z8673 Personal history of transient ischemic attack (TIA), and cerebral infarction without residual deficits: Secondary | ICD-10-CM | POA: Diagnosis not present

## 2018-07-31 DIAGNOSIS — G253 Myoclonus: Secondary | ICD-10-CM | POA: Diagnosis present

## 2018-07-31 DIAGNOSIS — N179 Acute kidney failure, unspecified: Secondary | ICD-10-CM | POA: Diagnosis present

## 2018-07-31 DIAGNOSIS — Z515 Encounter for palliative care: Secondary | ICD-10-CM | POA: Diagnosis not present

## 2018-07-31 DIAGNOSIS — Z7189 Other specified counseling: Secondary | ICD-10-CM | POA: Diagnosis not present

## 2018-07-31 DIAGNOSIS — A09 Infectious gastroenteritis and colitis, unspecified: Secondary | ICD-10-CM | POA: Diagnosis present

## 2018-07-31 DIAGNOSIS — Z789 Other specified health status: Secondary | ICD-10-CM | POA: Diagnosis not present

## 2018-07-31 LAB — COMPREHENSIVE METABOLIC PANEL
ALK PHOS: 100 U/L (ref 38–126)
ALT: 22 U/L (ref 0–44)
AST: 44 U/L — AB (ref 15–41)
Albumin: 3.3 g/dL — ABNORMAL LOW (ref 3.5–5.0)
Anion gap: 15 (ref 5–15)
BUN: 15 mg/dL (ref 8–23)
CO2: 15 mmol/L — AB (ref 22–32)
Calcium: 9 mg/dL (ref 8.9–10.3)
Chloride: 108 mmol/L (ref 98–111)
Creatinine, Ser: 1.3 mg/dL — ABNORMAL HIGH (ref 0.44–1.00)
GFR calc Af Amer: 46 mL/min — ABNORMAL LOW (ref >60)
GFR calc non Af Amer: 40 mL/min — ABNORMAL LOW (ref >60)
Glucose, Bld: 285 mg/dL — ABNORMAL HIGH (ref 70–99)
Potassium: 5.2 mmol/L — ABNORMAL HIGH (ref 3.5–5.1)
Sodium: 138 mmol/L (ref 135–145)
Total Bilirubin: 0.5 mg/dL (ref 0.3–1.2)
Total Protein: 6.1 g/dL — ABNORMAL LOW (ref 6.5–8.1)

## 2018-07-31 LAB — BASIC METABOLIC PANEL
Anion gap: 10 (ref 5–15)
BUN: 18 mg/dL (ref 8–23)
CALCIUM: 8.6 mg/dL — AB (ref 8.9–10.3)
CO2: 22 mmol/L (ref 22–32)
Chloride: 112 mmol/L — ABNORMAL HIGH (ref 98–111)
Creatinine, Ser: 1.13 mg/dL — ABNORMAL HIGH (ref 0.44–1.00)
GFR calc non Af Amer: 48 mL/min — ABNORMAL LOW (ref >60)
GFR, EST AFRICAN AMERICAN: 55 mL/min — AB (ref >60)
Glucose, Bld: 163 mg/dL — ABNORMAL HIGH (ref 70–99)
Potassium: 4.4 mmol/L (ref 3.5–5.1)
Sodium: 144 mmol/L (ref 135–145)

## 2018-07-31 LAB — CBC WITH DIFFERENTIAL/PLATELET
Abs Immature Granulocytes: 1.27 10*3/uL — ABNORMAL HIGH (ref 0.00–0.07)
Basophils Absolute: 0.1 10*3/uL (ref 0.0–0.1)
Basophils Relative: 1 %
Eosinophils Absolute: 0.3 10*3/uL (ref 0.0–0.5)
Eosinophils Relative: 3 %
HEMATOCRIT: 41.5 % (ref 36.0–46.0)
Hemoglobin: 11.5 g/dL — ABNORMAL LOW (ref 12.0–15.0)
Immature Granulocytes: 10 %
Lymphocytes Relative: 33 %
Lymphs Abs: 4.4 10*3/uL — ABNORMAL HIGH (ref 0.7–4.0)
MCH: 28.4 pg (ref 26.0–34.0)
MCHC: 27.7 g/dL — ABNORMAL LOW (ref 30.0–36.0)
MCV: 102.5 fL — ABNORMAL HIGH (ref 80.0–100.0)
Monocytes Absolute: 0.7 10*3/uL (ref 0.1–1.0)
Monocytes Relative: 5 %
NEUTROS ABS: 6.6 10*3/uL (ref 1.7–7.7)
NEUTROS PCT: 48 %
Platelets: 275 10*3/uL (ref 150–400)
RBC: 4.05 MIL/uL (ref 3.87–5.11)
RDW: 14.5 % (ref 11.5–15.5)
WBC: 13.3 10*3/uL — ABNORMAL HIGH (ref 4.0–10.5)
nRBC: 0.2 % (ref 0.0–0.2)

## 2018-07-31 LAB — BLOOD GAS, ARTERIAL
Acid-Base Excess: 1.4 mmol/L (ref 0.0–2.0)
Bicarbonate: 25.3 mmol/L (ref 20.0–28.0)
Drawn by: 44135
FIO2: 40
MECHVT: 490 mL
O2 Saturation: 92.2 %
PEEP: 5 cmH2O
Patient temperature: 98.6
RATE: 24 resp/min
pCO2 arterial: 39 mmHg (ref 32.0–48.0)
pH, Arterial: 7.428 (ref 7.350–7.450)
pO2, Arterial: 71.1 mmHg — ABNORMAL LOW (ref 83.0–108.0)

## 2018-07-31 LAB — GASTROINTESTINAL PANEL BY PCR, STOOL (REPLACES STOOL CULTURE)

## 2018-07-31 LAB — INFLUENZA PANEL BY PCR (TYPE A & B)
Influenza A By PCR: NEGATIVE
Influenza B By PCR: NEGATIVE

## 2018-07-31 LAB — PROCALCITONIN: Procalcitonin: 0.21 ng/mL

## 2018-07-31 LAB — MAGNESIUM: Magnesium: 1.8 mg/dL (ref 1.7–2.4)

## 2018-07-31 LAB — CBC
HCT: 38.9 % (ref 36.0–46.0)
Hemoglobin: 11.4 g/dL — ABNORMAL LOW (ref 12.0–15.0)
MCH: 28.9 pg (ref 26.0–34.0)
MCHC: 29.3 g/dL — ABNORMAL LOW (ref 30.0–36.0)
MCV: 98.5 fL (ref 80.0–100.0)
PLATELETS: 223 10*3/uL (ref 150–400)
RBC: 3.95 MIL/uL (ref 3.87–5.11)
RDW: 14.6 % (ref 11.5–15.5)
WBC: 9.9 10*3/uL (ref 4.0–10.5)
nRBC: 0 % (ref 0.0–0.2)

## 2018-07-31 LAB — C DIFFICILE QUICK SCREEN W PCR REFLEX
C Diff antigen: NEGATIVE
C Diff interpretation: NOT DETECTED
C Diff toxin: NEGATIVE

## 2018-07-31 LAB — GLUCOSE, CAPILLARY
GLUCOSE-CAPILLARY: 163 mg/dL — AB (ref 70–99)
GLUCOSE-CAPILLARY: 122 mg/dL — AB (ref 70–99)
Glucose-Capillary: 80 mg/dL (ref 70–99)
Glucose-Capillary: 94 mg/dL (ref 70–99)

## 2018-07-31 LAB — HEMOGLOBIN A1C
HEMOGLOBIN A1C: 6.3 % — AB (ref 4.8–5.6)
Mean Plasma Glucose: 134.11 mg/dL

## 2018-07-31 LAB — LACTIC ACID, PLASMA
Lactic Acid, Venous: 3.1 mmol/L (ref 0.5–1.9)
Lactic Acid, Venous: 9.2 mmol/L (ref 0.5–1.9)

## 2018-07-31 LAB — CORTISOL: Cortisol, Plasma: 30.1 ug/dL

## 2018-07-31 LAB — PHOSPHORUS: Phosphorus: 4.9 mg/dL — ABNORMAL HIGH (ref 2.5–4.6)

## 2018-07-31 LAB — TRIGLYCERIDES: Triglycerides: 77 mg/dL (ref <150)

## 2018-07-31 LAB — BRAIN NATRIURETIC PEPTIDE
B Natriuretic Peptide: 463.7 pg/mL — ABNORMAL HIGH (ref 0.0–100.0)
B Natriuretic Peptide: 698.6 pg/mL — ABNORMAL HIGH (ref 0.0–100.0)

## 2018-07-31 MED ORDER — SODIUM BICARBONATE 8.4 % IV SOLN
150.0000 meq | Freq: Once | INTRAVENOUS | Status: AC
  Administered 2018-07-31: 150 meq via INTRAVENOUS

## 2018-07-31 MED ORDER — IPRATROPIUM-ALBUTEROL 0.5-2.5 (3) MG/3ML IN SOLN
3.0000 mL | Freq: Four times a day (QID) | RESPIRATORY_TRACT | Status: DC
  Administered 2018-07-31 – 2018-08-03 (×11): 3 mL via RESPIRATORY_TRACT
  Filled 2018-07-31 (×12): qty 3

## 2018-07-31 MED ORDER — NOREPINEPHRINE 4 MG/250ML-% IV SOLN
0.0000 ug/min | INTRAVENOUS | Status: DC
  Administered 2018-07-31: 2 ug/min via INTRAVENOUS
  Filled 2018-07-31: qty 250

## 2018-07-31 MED ORDER — ORAL CARE MOUTH RINSE
15.0000 mL | OROMUCOSAL | Status: DC
  Administered 2018-07-31 – 2018-08-03 (×29): 15 mL via OROMUCOSAL

## 2018-07-31 MED ORDER — FENTANYL 2500MCG IN NS 250ML (10MCG/ML) PREMIX INFUSION
25.0000 ug/h | INTRAVENOUS | Status: DC
  Administered 2018-07-31: 50 ug/h via INTRAVENOUS
  Administered 2018-08-01: 100 ug/h via INTRAVENOUS
  Filled 2018-07-31 (×2): qty 250

## 2018-07-31 MED ORDER — ALBUTEROL SULFATE (2.5 MG/3ML) 0.083% IN NEBU: 2.5000 mg | INHALATION_SOLUTION | RESPIRATORY_TRACT | Status: DC

## 2018-07-31 MED ORDER — FENTANYL BOLUS VIA INFUSION
25.0000 ug | INTRAVENOUS | Status: DC | PRN
  Filled 2018-07-31: qty 25

## 2018-07-31 MED ORDER — PROPOFOL 1000 MG/100ML IV EMUL
0.0000 ug/kg/min | INTRAVENOUS | Status: DC
  Administered 2018-07-31 (×3): 40 ug/kg/min via INTRAVENOUS
  Administered 2018-07-31: 50 ug/kg/min via INTRAVENOUS
  Administered 2018-08-01: 40 ug/kg/min via INTRAVENOUS
  Administered 2018-08-01: 10 ug/kg/min via INTRAVENOUS
  Administered 2018-08-01: 40 ug/kg/min via INTRAVENOUS
  Filled 2018-07-31 (×8): qty 100

## 2018-07-31 MED ORDER — SODIUM CHLORIDE 0.9 % IV SOLN
1.0000 g | Freq: Every day | INTRAVENOUS | Status: DC
  Administered 2018-07-31 – 2018-08-02 (×4): 1 g via INTRAVENOUS
  Filled 2018-07-31 (×5): qty 10

## 2018-07-31 MED ORDER — SODIUM CHLORIDE 0.9 % IV BOLUS (SEPSIS): 2000.0000 mL | Freq: Once | INTRAVENOUS | Status: DC

## 2018-07-31 MED ORDER — IOHEXOL 350 MG/ML SOLN
75.0000 mL | Freq: Once | INTRAVENOUS | Status: AC | PRN
  Administered 2018-07-31: 75 mL via INTRAVENOUS

## 2018-07-31 MED ORDER — FENTANYL CITRATE (PF) 100 MCG/2ML IJ SOLN
50.0000 ug | Freq: Once | INTRAMUSCULAR | Status: AC
  Administered 2018-08-02: 50 ug via INTRAVENOUS
  Filled 2018-07-31: qty 2

## 2018-07-31 MED ORDER — HYDRALAZINE HCL 20 MG/ML IJ SOLN
10.0000 mg | INTRAMUSCULAR | Status: DC | PRN
  Administered 2018-07-31 – 2018-08-03 (×8): 10 mg via INTRAVENOUS
  Filled 2018-07-31 (×10): qty 1

## 2018-07-31 MED ORDER — INSULIN ASPART 100 UNIT/ML ~~LOC~~ SOLN
0.0000 [IU] | SUBCUTANEOUS | Status: DC
  Administered 2018-07-31 (×2): 3 [IU] via SUBCUTANEOUS
  Administered 2018-08-01 (×4): 2 [IU] via SUBCUTANEOUS
  Administered 2018-08-02 (×2): 3 [IU] via SUBCUTANEOUS
  Administered 2018-08-02 (×2): 2 [IU] via SUBCUTANEOUS
  Administered 2018-08-02: 3 [IU] via SUBCUTANEOUS
  Administered 2018-08-03 (×3): 2 [IU] via SUBCUTANEOUS

## 2018-07-31 MED ORDER — HEPARIN SODIUM (PORCINE) 5000 UNIT/ML IJ SOLN
5000.0000 [IU] | Freq: Three times a day (TID) | INTRAMUSCULAR | Status: DC
  Administered 2018-07-31 – 2018-08-03 (×9): 5000 [IU] via SUBCUTANEOUS
  Filled 2018-07-31 (×9): qty 1

## 2018-07-31 MED ORDER — VALPROATE SODIUM 500 MG/5ML IV SOLN
500.0000 mg | Freq: Two times a day (BID) | INTRAVENOUS | Status: DC
  Administered 2018-08-01 (×2): 500 mg via INTRAVENOUS
  Filled 2018-07-31 (×3): qty 5

## 2018-07-31 MED ORDER — SODIUM CHLORIDE 0.9 % IV SOLN
500.0000 mg | Freq: Every day | INTRAVENOUS | Status: DC
  Administered 2018-07-31 – 2018-08-02 (×4): 500 mg via INTRAVENOUS
  Filled 2018-07-31 (×5): qty 500

## 2018-07-31 MED ORDER — CHLORHEXIDINE GLUCONATE 0.12% ORAL RINSE (MEDLINE KIT)
15.0000 mL | Freq: Two times a day (BID) | OROMUCOSAL | Status: DC
  Administered 2018-07-31 – 2018-08-03 (×7): 15 mL via OROMUCOSAL

## 2018-07-31 MED ORDER — PANTOPRAZOLE SODIUM 40 MG IV SOLR
40.0000 mg | Freq: Every day | INTRAVENOUS | Status: DC
  Administered 2018-07-31 – 2018-08-01 (×2): 40 mg via INTRAVENOUS
  Filled 2018-07-31 (×4): qty 40

## 2018-07-31 MED ORDER — ALBUTEROL SULFATE HFA 108 (90 BASE) MCG/ACT IN AERS
2.0000 | INHALATION_SPRAY | RESPIRATORY_TRACT | Status: DC
  Filled 2018-07-31: qty 6.7

## 2018-07-31 MED ORDER — LORAZEPAM 2 MG/ML IJ SOLN
INTRAMUSCULAR | Status: AC
  Filled 2018-07-31: qty 1

## 2018-07-31 MED ORDER — LORAZEPAM 2 MG/ML IJ SOLN
2.0000 mg | INTRAMUSCULAR | Status: AC
  Administered 2018-07-31: 2 mg via INTRAVENOUS

## 2018-07-31 MED ORDER — FUROSEMIDE 10 MG/ML IJ SOLN
40.0000 mg | Freq: Once | INTRAMUSCULAR | Status: AC
  Administered 2018-07-31: 40 mg via INTRAVENOUS
  Filled 2018-07-31: qty 4

## 2018-07-31 MED ORDER — VALPROATE SODIUM 500 MG/5ML IV SOLN
2.0000 g | Freq: Once | INTRAVENOUS | Status: AC
  Administered 2018-07-31: 2000 mg via INTRAVENOUS
  Filled 2018-07-31: qty 20

## 2018-07-31 MED ORDER — FENTANYL 2500MCG IN NS 250ML (10MCG/ML) PREMIX INFUSION: 0.0000 ug/h | INTRAVENOUS | Status: DC

## 2018-07-31 NOTE — Progress Notes (Signed)
Spoke with Dr. Delton Coombes in regards to EEG order and pt's differential diagnosis of COVID-19. Per lab policy at this time we are only doing EEGs on suspected status epilepticus on this population. Dr. Wilford Corner (with neurology) will communicate with Dr. Delton Coombes and will help determine the priority of this EEG at this time.

## 2018-07-31 NOTE — Progress Notes (Signed)
Update given to patient's daughter, Lawson Fiscal.

## 2018-07-31 NOTE — Plan of Care (Signed)
  Problem: Clinical Measurements: Goal: Cardiovascular complication will be avoided Outcome: Progressing   Problem: Respiratory: Goal: Ability to maintain a clear airway and adequate ventilation will improve Outcome: Progressing

## 2018-07-31 NOTE — ED Notes (Signed)
CCM at bedside 

## 2018-07-31 NOTE — ED Provider Notes (Addendum)
TIME SEEN: 12:04 AM  CHIEF COMPLAINT: Cardiac arrest  HPI: Patient is a 78 year old female with history of hypertension, diabetes, CHF followed by Dr. Tresa Endo who presents to the emergency department with EMS status post cardiac arrest.  EMS reports downtime of approximately 5 to 10 minutes.  When they arrived patient was in asystole.  Started CPR and gave 2 rounds of epinephrine.  Had return of spontaneous circulation and appeared to be in a junctional rhythm.  They began pacing the patient.  Blood pressures were in the 180s systolic.  Blood sugar in the 140s.  ROS: level 5 caveat secondary to cardiac arrest, patient unresponsive  PAST MEDICAL HISTORY/PAST SURGICAL HISTORY:  Past Medical History:  Diagnosis Date  . Diabetes mellitus without complication (HCC)   . Hypertension   . TIA (transient ischemic attack)     MEDICATIONS:  Prior to Admission medications   Medication Sig Start Date End Date Taking? Authorizing Provider  atorvastatin (LIPITOR) 40 MG tablet Take 1 tablet by mouth daily. 07/11/18   [provider]  escitalopram (LEXAPRO) 10 MG tablet Take 10 mg by mouth daily.    [provider]  fluticasone (FLONASE) 50 MCG/ACT nasal spray 1 to 2 sprays each nostril as needed 07/10/13   [provider]  folic acid (FOLVITE) 1 MG tablet Take 1 mg by mouth daily. 02/01/18   [provider]  GARLIC PO Take 2 capsules by mouth daily.    [provider]  hydrochlorothiazide (HYDRODIURIL) 12.5 MG tablet Take 2 tablets (25 mg total) by mouth daily. 05/17/18   Dhungel, Theda Belfast, MD  leflunomide (ARAVA) 10 MG tablet Take 10 mg by mouth daily.    [provider]  LORazepam (ATIVAN) 1 MG tablet Take 1 mg by mouth 2 (two) times daily as needed for anxiety.     [provider]  meloxicam (MOBIC) 15 MG tablet Take 15 mg by mouth daily.    [provider]  metFORMIN (GLUCOPHAGE) 1000 MG tablet Take 1 tablet (1,000 mg total) by mouth 2  (two) times daily with a meal. 06/12/18   Clydie Braun, MD  methotrexate (RHEUMATREX) 2.5 MG tablet Take 2.5 mg by mouth every Tuesday.  05/31/18   [provider]  metoprolol succinate (TOPROL-XL) 100 MG 24 hr tablet Take 1 tablet (100 mg total) by mouth daily. Take with or immediately following a meal. 07/18/18 10/16/18  Lennette Bihari, MD  Multiple Vitamin (MULTIVITAMIN WITH MINERALS) TABS tablet Take 1 tablet by mouth daily.    [provider]  spironolactone (ALDACTONE) 25 MG tablet Take 0.5 tablets (12.5 mg total) by mouth 2 (two) times daily. 07/18/18 10/16/18  Lennette Bihari, MD  valsartan (DIOVAN) 320 MG tablet Take 320 mg by mouth daily. 02/01/18   [provider]  Vitamin D, Ergocalciferol, (DRISDOL) 1.25 MG (50000 UT) CAPS capsule Take 1 capsule (50,000 Units total) by mouth every 7 (seven) days. 05/17/18   Dhungel, Theda Belfast, MD    ALLERGIES:  No Known Allergies  SOCIAL HISTORY:  Social History   Tobacco Use  . Smoking status: Never Smoker  . Smokeless tobacco: Never Used  Substance Use Topics  . Alcohol use: No    FAMILY HISTORY: Family History  Problem Relation Age of Onset  . Colon cancer Brother   . Colon cancer Brother   . Lung cancer Sister        never smoked  . Heart disease Mother   . Breast cancer Mother   .  Heart attack Sister   . Heart disease Brother   . Breast cancer Sister     EXAM: BP (!) 62/54 Comment: manual   Pulse (!) 40   Resp (!) 22   Ht 5\' 7"  (1.702 m) Comment: measured by RT  SpO2 100%   BMI 28.91 kg/m  CONSTITUTIONAL: Patient will open her eyes intermittently and appears to be coughing around the endotracheal tube.  Does not follow commands. HEAD: Normocephalic, atraumatic  eYES: Conjunctivae clear, pupils appear equal, pupils approximately 3 mm bilaterally and sluggishly reactive ENT: normal nose; moist mucous membranes, ET tube in place NECK: Supple, no meningismus, no nuchal rigidity, no LAD  CARD:  Irregularly irregular; S1 and S2 appreciated; no murmurs, no clicks, no rubs, no gallops RESP: Slightly rhonchorous breath sounds bilaterally, no wheezing, patient is intubated ABD/GI: sLightly distended abdomen, soft, no ecchymosis BACK:  The back appears normal EXT: No edema SKIN: Normal color for age and race; warm; no rash NEURO: Patient will open her eyes and cough but does not follow commands or answer questions  MEDICAL DECISION MAKING: Patient here status post cardiac arrest.  Initially patient hypertensive and coughing, opening eyes.  Started on propofol for sedation.  Blood sugar here in the 230s.  EKG shows atrial fibrillation.  External pacemaker has been turned off.  While in the room, patient appears to be having rhythmic movements of her face and eyes concerning for seizures.  Given 2 mg of IV Ativan as well as propofol and this has stopped.   Blood pressures began dropping after propofol.  Given IV fluids.  Levophed ordered but blood pressure improved after propofol off and pressors not started.  Will sedate with fentanyl drip.  No further seizure activity noted.  Will obtain head CT.   ABG shows metabolic acidosis likely from lactic acidosis secondary to cardiac arrest.  Will give 3 amps of bicarb.   Chest x-ray concerning for pulmonary edema.  Will order IV Lasix.   Talk to her daughter in the conference room.  She reports that for several days patient has had a dry cough and has felt warm to touch.  She also reports the patient has had lots of diarrhea but this is a chronic issue for her.  She has had several episodes of diarrhea here in the ED.  Nursing staff sent a sample for C. difficile testing.  Daughter denies history of seizures.  She denies known history of A. fib.  Denies any recent traumas but states she has been very lethargic and has had 2 falls but the daughter has caught her before she hit the ground.   Discussed with Dr. Darrick Penna with critical care.  ICU  team will see patient for admission.   2:45 AM  I am concerned given patient's daughter reports dry cough, subjective fevers, diarrhea that patient could have novel coronavirus.  I have discussed this with ICU physician, Dr. Ardeth Perfect.  I have ordered testing for flu as well as COVID-19.  Daughter denied any recent travel or known positive exposure but given there has been community spread in [place], I feel that patient does not need to have the specific risk factors to indicate testing.   ELETA KOLBE was evaluated in Emergency Department on 07/31/2018 for the symptoms described in the history of present illness. She was evaluated in the context of the global COVID-19 pandemic, which necessitated consideration that the patient might be at risk for infection with the SARS-CoV-2 virus that causes COVID-19.  Institutional protocols and algorithms that pertain to the evaluation of patients at risk for COVID-19 are in a state of rapid change based on information released by regulatory bodies including the CDC and federal and state organizations. These policies and algorithms were followed during the patient's care in the ED.      EKG Interpretation  Date/Time:  Saturday July 30 2018 23:18:33 EDT Ventricular Rate:  111 PR Interval:    QRS Duration: 102 QT Interval:  404 QTC Calculation: 492 R Axis:   112 Text Interpretation:  Atrial fibrillation Paired ventricular premature complexes Right axis deviation Consider left ventricular hypertrophy Anterior Q waves, possibly due to LVH A fib new compared to previous Confirmed by Rochele Raring [phone]) on 07/31/2018 12:05:37 AM        CRITICAL CARE Performed by: Baxter Hire Ailish Prospero   Total critical care time: 75 minutes  Critical care time was exclusive of separately billable procedures and treating other patients.  Critical care was necessary to treat or prevent imminent or life-threatening deterioration.  Critical care was time spent personally  by me on the following activities: development of treatment plan with patient and/or surrogate as well as nursing, discussions with consultants, evaluation of patient's response to treatment, examination of patient, obtaining history from patient or surrogate, ordering and performing treatments and interventions, ordering and review of laboratory studies, ordering and review of radiographic studies, pulse oximetry and re-evaluation of patient's condition.    Jaion Lagrange, Layla Maw, DO 07/31/18 0108    Jahari Wiginton, Layla Maw, DO 07/31/18 [phone]

## 2018-07-31 NOTE — Progress Notes (Addendum)
Progress note  Patient admitted status post cardiac arrest with concern for COVID-19 preceding the cardiac arrest. Dr. Delton Coombes called me with the clinical picture that is concerning for myoclonus. Overnight exam by Dr. Ardeth Perfect also concerning for myoclonus. Hooking her up EEG, given the call with concerns would not change the course as she has no history of seizure disorder.  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  -Propofol drip-currently running at 40 -Load with Depakote 20 mg/kg and continue 500 twice daily from tomorrow. - Klonopin 0.5 mg 3 times daily via NG tube -If this is not controlled myoclonic movements, can load with Keppra 1500 mg and start Keppra 500 twice daily going forward.  Once the testing for cord comes back, we can assess her in person or sooner if necessary.  Communicated with Dr. Delton Coombes on the phone.  -- Milon Dikes, MD [facility] Pager: [phone] If 7pm to 7am, please call on call as listed on [facility]ION.

## 2018-07-31 NOTE — Progress Notes (Signed)
Patient transported from ED Resus to CT and then to 2M05 with no complications.

## 2018-07-31 NOTE — Procedures (Signed)
Central Venous Catheter Insertion Procedure Note SEANNA DAISEY [id number] [birth year]  Procedure: Insertion of Central Venous Catheter Indications: Assessment of intravascular volume, Drug and/or fluid administration and Frequent blood sampling  Procedure Details Consent: Unable to obtain consent because of emergent medical necessity. Time Out: Verified patient identification, verified procedure, site/side was marked, verified correct patient position, special equipment/implants available, medications/allergies/relevent history reviewed, required imaging and test results available.  Performed  Maximum sterile technique was used including antiseptics, cap, gloves, gown, hand hygiene, mask and sheet. Skin prep: Chlorhexidine; local anesthetic administered A antimicrobial bonded/coated triple lumen catheter was placed in the left internal jugular vein using the Seldinger technique.  Evaluation Blood flow good Complications: No apparent complications Patient did tolerate procedure well. Chest X-ray ordered to verify placement.  CXR: normal.   Marcelle Smiling, MD Board Certified by the [employer], Pulmonary Diseases & Critical Care Medicine   07/31/2018, 2:57 AM

## 2018-07-31 NOTE — H&P (Addendum)
NAME:  Melinda Mann MRN:  [id number] DOB:  [birth year] LOS: 0 ADMISSION DATE:  07/24/2018 DATE OF SERVICE:  07/31/2018  CHIEF COMPLAINT:  Cardiac arrest/asystole   HISTORY & PHYSICAL  History of Present Illness  This 78 y.o. Caucasian female non-smoker [facility] with complaints of cardiac arrest.  The patient apparently had a witnessed arrest at home.  Initial rhythm was reported to be asystole.  Reportedly, ROSC was achieved with 5 minutes of CPR with 2 amps epinephrine administered.  She was subsequently in a junctional rhythm, prompting external pacing.  Currently, she is off of the external pacer and is in a sinus rhythm.  At the time of clinical interview, the patient is in airborne and contact isolation for possible COVID-19.  This was apparently prompted by family members report that the patient has been having fever, cough and severe diarrhea.   REVIEW OF SYSTEMS This patient is critically ill and cannot provide additional history nor review of systems due to mental status/unconsciousness.   Past Medical/Surgical/Social/Family History   Past Medical History:  Diagnosis Date  . Diabetes mellitus without complication (HCC)   . Hypertension   . TIA (transient ischemic attack)     Past Surgical History:  Procedure Laterality Date  . LEFT HEART CATH AND CORONARY ANGIOGRAPHY N/A 05/17/2018   Procedure: LEFT HEART CATH AND CORONARY ANGIOGRAPHY;  Surgeon: Corky CraftsVaranasi, Jayadeep S, MD;  [facility] CV LAB;  Service: Cardiovascular;  Laterality: N/A;    Social History   Tobacco Use  . Smoking status: Never Smoker  . Smokeless tobacco: Never Used  Substance Use Topics  . Alcohol use: No    Family History  Problem Relation Age of Onset  . Colon cancer Brother   . Colon cancer Brother   . Lung cancer Sister        never smoked  . Heart disease Mother   . Breast cancer Mother   . Heart attack Sister    . Heart disease Brother   . Breast cancer Sister      Procedures:  N/A   Significant Diagnostic Tests:  N/A   Micro Data:  No results found for this or any previous visit.    Antimicrobials:  N/A   Interim history/subjective:  N/A   Objective   BP 131/73   Pulse 66   Temp (!) 96 F (35.6 C) (Rectal)   Resp 18   Ht 5\' 7"  (1.702 m) Comment: measured by RT  SpO2 97%   BMI 28.91 kg/m     There were no vitals filed for this visit.  Intake/Output Summary (Last 24 hours) at 07/31/2018 0149 Last data filed at 07/31/2018 0002 Gross per 24 hour  Intake 7.2 ml  Output -  Net 7.2 ml    Vent Mode: PRVC FiO2 (%):  [60 %] 60 % Set Rate:  [18 bmp] 18 bmp Vt Set:  [490 mL] 490 mL PEEP:  [5 cmH20] 5 cmH20 Plateau Pressure:  [21 cmH20] 21 cmH20   Examination: GENERAL: Intubated.  Comatose.  Myoclonic jerks. HEAD: normocephalic, atraumatic EYE: PERRLA, no scleral icterus, no pallor. NOSE: nares are patent. No polyps. No exudate. THROAT/ORAL CAVITY: Normal dentition. No oral thrush. No exudate. Mucous membranes are moist. No tonsillar enlargement. ETT in situ.  Copious oral secretions.   NECK: supple, no thyromegaly, no JVD, no lymphadenopathy. Trachea midline. CHEST/LUNG: symmetric in development and expansion.  Coarse breath sounds.  Bilateral rales.  Scattered rhonchi.Marland Kitchen HEART: Regular S1 and S2 without murmur, rub or gallop. ABDOMEN: soft, nontender, nondistended. Normoactive bowel sounds. No rebound. No guarding. No hepatosplenomegaly. EXTREMITIES: Edema: none. No cyanosis.  No clubbing. 2+ DP pulses LYMPHATIC: no cervical/axillary/inguinal lymph nodes appreciated MUSCULOSKELETAL: No point tenderness.  No bulk atrophy. Joints: No deformity.  SKIN: No rash or lesion.  NEUROLOGIC: Myoclonic jerks.  Synchronous with the ventilator.  Corneal reflex intact. Spontaneous respirations intact.  Equivocal doll's eyes.  Gag/cough intact.  Cranial nerves II-XII are grossly  symmetric and physiologic.  Babinski absent. DTR: 2+ @ R biceps, 2+ @ L biceps, 2+ @ R patellar,  2+ @ L patellar. No cerebellar signs. Gait was not assessed.   Resolved Hospital Problem list      Assessment & Plan:   ASSESSMENT/PLAN:  ASSESSMENT (included in the Hospital Problem List)  Principal Problem:   Acute hypercapnic respiratory failure (HCC) Active Problems:   Anoxic encephalopathy (HCC)   Acute decompensated heart failure (HCC)   Hyperkalemia   Diarrhea   Acute kidney failure (HCC)   Lactic acidosis   Leukocytosis   Macrocytic anemia   Myoclonus   By systems: PULMONARY  Acute hypercapnic respiratory failure. CTA chest to rule out pulmonary embolism ABG now. Vent settings will be titrated based on ABG results. Trach aspirate for Gram stain, C/S. Empiric Rocephin/azithromcyin for now.   CARDIOVASCULAR  Acute decompensated CHF, baseline LVEF 40-45%  Asystole with ROSC after CPR 2D echo   RENAL  Acute kidney injury  Hyperkalemia  Anion gap metabolic acidosis/lactic acidosis Consider starting bicarbonate infusion.   GASTROINTESTINAL  Diarrhea, likely infectious  GI PROPHYLAXIS: Protonix C diff pending. Stool for PCR. Low threshold to discontinue empiric antibiotics (with ongoing diarrhea) in the event that trach aspirate is unrevealing. U/A and UDS.   HEMATOLOGIC  Leukocytosis without lymphocytosis or monocytosis  Macrocytic anemia, mild  DVT PROPHYLAXIS: Heparin if CT head negative for hemorrhage   INFECTIOUS  COVID-19 test pending Empiric antibiotics for community-acquired pneumonia.   ENDOCRINE  Type 2 diabetes mellitus with vascular complications Accuchecks. Sliding scale insulin.   NEUROLOGIC  Anoxic encephalopathy  Myoclonus  GCS 3T CT head without contrast to rule out stroke. May need EEG to differentiate seizure vs myoclonic jerks.   PLAN/RECOMMENDATIONS   Admit to ICU under my service (Attending: Marcelle Smiling,  MD) with the diagnoses highlighted above in the active Hospital Problem List (ASSESSMENT).  PROGNOSIS: grim.     My assessment, plan of care, findings, medications, side effects, etc. were discussed with: nurse and respiratory therapist.   Best practice:  Diet: Clinical nutrition consult for tube feed recommendations Pain/Anxiety/Delirium protocol (if indicated): Propofol/fentanyl VAP protocol (if indicated): Yes DVT prophylaxis: Subcutaneous heparin GI prophylaxis: Protonix Glucose control: Accu-Cheks.  Sliding scale insulin. Mobility/Activity: Bedrest   Code Status: Full Code Family Communication:  No family at the bedside Disposition: Admit to ICU   Labs   CBC: Recent Labs  Lab 07/15/2018 2335 07/05/2018 2336 07/09/2018 2343  WBC 13.3*  --   --   NEUTROABS 6.6  --   --   HGB 11.5* 12.2 12.2  HCT 41.5 36.0 36.0  MCV 102.5*  --   --   PLT 275  --   --     Basic Metabolic Panel: Recent Labs  Lab 07/23/2018 2335 07/08/2018 2336 07/05/2018 2343  NA 138 141 142  K 5.2* 5.5* 5.2*  CL 108  --   --   CO2 15*  --   --  GLUCOSE 285*  --   --   BUN 15  --   --   CREATININE 1.30*  --   --   CALCIUM 9.0  --   --    GFR: Estimated Creatinine Clearance: 41.6 mL/min (A) (by C-G formula based on SCr of 1.3 mg/dL (H)). Recent Labs  Lab 08/09/2018 2335  WBC 13.3*  LATICACIDVEN 9.2*    Liver Function Tests: Recent Labs  Lab 09-Aug-2018 2335  AST 44*  ALT 22  ALKPHOS 100  BILITOT 0.5  PROT 6.1*  ALBUMIN 3.3*   No results for input(s): LIPASE, AMYLASE in the last 168 hours. No results for input(s): AMMONIA in the last 168 hours.  ABG    Component Value Date/Time   PHART 7.013 (LL) 08-09-2018 2336   PCO2ART 54.7 (H) 2018-08-09 2336   PO2ART 284.0 (H) 2018-08-09 2336   HCO3 15.8 (L) 08-09-18 2343   TCO2 18 (L) August 09, 2018 2343   ACIDBASEDEF 16.0 (H) 2018/08/09 2343   O2SAT 60.0 09-Aug-2018 2343     Coagulation Profile: Recent Labs  Lab 08/09/18 2335  INR 1.2     Cardiac Enzymes: No results for input(s): CKTOTAL, CKMB, CKMBINDEX, TROPONINI in the last 168 hours.  HbA1C: Hgb A1c MFr Bld  Date/Time Value Ref Range Status  06/12/2018 06:57 AM 6.5 (H) 4.8 - 5.6 % Final    Comment:    (NOTE) Pre diabetes:          5.7%-6.4% Diabetes:              >6.4% Glycemic control for   <7.0% adults with diabetes     CBG: Recent Labs  Lab 08-09-18 2313  GLUCAP 239*     Past Medical History   Past Medical History:  Diagnosis Date  . Diabetes mellitus without complication (HCC)   . Hypertension   . TIA (transient ischemic attack)       Surgical History    Past Surgical History:  Procedure Laterality Date  . LEFT HEART CATH AND CORONARY ANGIOGRAPHY N/A 05/17/2018   Procedure: LEFT HEART CATH AND CORONARY ANGIOGRAPHY;  Surgeon: Corky Crafts, MD;  [facility]: [facility] INVASIVE CV LAB;  Service: Cardiovascular;  Laterality: N/A;      Social History   Social History   Socioeconomic History  . Marital status: Widowed    Spouse name: Not on file  . Number of children: Not on file  . Years of education: Not on file  . Highest education level: Not on file  Occupational History  . Occupation: Retired- Haematologist  Social Needs  . Financial resource strain: Not on file  . Food insecurity:    Worry: Not on file    Inability: Not on file  . Transportation needs:    Medical: Not on file    Non-medical: Not on file  Tobacco Use  . Smoking status: Never Smoker  . Smokeless tobacco: Never Used  Substance and Sexual Activity  . Alcohol use: No  . Drug use: No  . Sexual activity: Not on file  Lifestyle  . Physical activity:    Days per week: Not on file    Minutes per session: Not on file  . Stress: Not on file  Relationships  . Social connections:    Talks on phone: Not on file    Gets together: Not on file    Attends religious service: Not on file    Active member of club or organization: Not on file  Attends meetings of clubs  or organizations: Not on file    Relationship status: Not on file  Other Topics Concern  . Not on file  Social History Narrative  . Not on file      Family History    Family History  Problem Relation Age of Onset  . Colon cancer Brother   . Colon cancer Brother   . Lung cancer Sister        never smoked  . Heart disease Mother   . Breast cancer Mother   . Heart attack Sister   . Heart disease Brother   . Breast cancer Sister    family history includes Breast cancer in her mother and sister; Colon cancer in her brother and brother; Heart attack in her sister; Heart disease in her brother and mother; Lung cancer in her sister.    Allergies No Known Allergies    Current Medications  Current Facility-Administered Medications:  .  atropine 1 MG/10ML injection, , , ,  .  fentaNYL in NS (23mcg/ml) infusion-PREMIX, 0-400 mcg/hr, Intravenous, Continuous, Ward, Kristen N, DO .  norepinephrine (LEVOPHED) 4mg  in premix infusion, 0-40 mcg/min, Intravenous, Continuous, Ward, Layla Maw, DO, Stopped at 07/31/18 0044 .  propofol (DIPRIVAN) 1000 MG/100ML infusion, 5-80 mcg/kg/min, Intravenous, Continuous, Ward, Kristen N, DO, Last Rate: 15.07 mL/hr at 07/31/18 0102, 30 mcg/kg/min at 07/31/18 0102  Current Outpatient Medications:  .  atorvastatin (LIPITOR) 40 MG tablet, Take 1 tablet by mouth daily., Disp: , Rfl:  .  escitalopram (LEXAPRO) 10 MG tablet, Take 10 mg by mouth daily., Disp: , Rfl:  .  fluticasone (FLONASE) 50 MCG/ACT nasal spray, 1 to 2 sprays each nostril as needed, Disp: , Rfl:  .  folic acid (FOLVITE) 1 MG tablet, Take 1 mg by mouth daily., Disp: , Rfl: 1 .  GARLIC PO, Take 2 capsules by mouth daily., Disp: , Rfl:  .  hydrochlorothiazide (HYDRODIURIL) 12.5 MG tablet, Take 2 tablets (25 mg total) by mouth daily., Disp: 30 tablet, Rfl: 2 .  leflunomide (ARAVA) 10 MG tablet, Take 10 mg by mouth daily., Disp: , Rfl:  .  LORazepam (ATIVAN) 1 MG tablet, Take 1  mg by mouth 2 (two) times daily as needed for anxiety. , Disp: , Rfl:  .  meloxicam (MOBIC) 15 MG tablet, Take 15 mg by mouth daily., Disp: , Rfl:  .  metFORMIN (GLUCOPHAGE) 1000 MG tablet, Take 1 tablet (1,000 mg total) by mouth 2 (two) times daily with a meal., Disp: 60 tablet, Rfl: 0 .  methotrexate (RHEUMATREX) 2.5 MG tablet, Take 2.5 mg by mouth every Tuesday. , Disp: , Rfl:  .  metoprolol succinate (TOPROL-XL) 100 MG 24 hr tablet, Take 1 tablet (100 mg total) by mouth daily. Take with or immediately following a meal., Disp: 90 tablet, Rfl: 1 .  Multiple Vitamin (MULTIVITAMIN WITH MINERALS) TABS tablet, Take 1 tablet by mouth daily., Disp: , Rfl:  .  spironolactone (ALDACTONE) 25 MG tablet, Take 0.5 tablets (12.5 mg total) by mouth 2 (two) times daily., Disp: 45 tablet, Rfl: 3 .  valsartan (DIOVAN) 320 MG tablet, Take 320 mg by mouth daily., Disp: , Rfl: 0 .  Vitamin D, Ergocalciferol, (DRISDOL) 1.25 MG (50000 UT) CAPS capsule, Take 1 capsule (50,000 Units total) by mouth every 7 (seven) days., Disp: 8 capsule, Rfl: 0   Home Medications  Prior to Admission medications   Medication Sig Start Date End Date Taking? Authorizing Provider  atorvastatin (LIPITOR) 40  MG tablet Take 1 tablet by mouth daily. 07/11/18   [provider]  escitalopram (LEXAPRO) 10 MG tablet Take 10 mg by mouth daily.    [provider]  fluticasone (FLONASE) 50 MCG/ACT nasal spray 1 to 2 sprays each nostril as needed 07/10/13   [provider]  folic acid (FOLVITE) 1 MG tablet Take 1 mg by mouth daily. 02/01/18   [provider]  GARLIC PO Take 2 capsules by mouth daily.    [provider]  hydrochlorothiazide (HYDRODIURIL) 12.5 MG tablet Take 2 tablets (25 mg total) by mouth daily. 05/17/18   Dhungel, Theda Belfast, MD  leflunomide (ARAVA) 10 MG tablet Take 10 mg by mouth daily.    [provider]  LORazepam (ATIVAN) 1 MG tablet Take 1 mg by mouth 2 (two) times daily as needed  for anxiety.     [provider]  meloxicam (MOBIC) 15 MG tablet Take 15 mg by mouth daily.    [provider]  metFORMIN (GLUCOPHAGE) 1000 MG tablet Take 1 tablet (1,000 mg total) by mouth 2 (two) times daily with a meal. 06/12/18   Clydie Braun, MD  methotrexate (RHEUMATREX) 2.5 MG tablet Take 2.5 mg by mouth every Tuesday.  05/31/18   [provider]  metoprolol succinate (TOPROL-XL) 100 MG 24 hr tablet Take 1 tablet (100 mg total) by mouth daily. Take with or immediately following a meal. 07/18/18 10/16/18  Lennette Bihari, MD  Multiple Vitamin (MULTIVITAMIN WITH MINERALS) TABS tablet Take 1 tablet by mouth daily.    [provider]  spironolactone (ALDACTONE) 25 MG tablet Take 0.5 tablets (12.5 mg total) by mouth 2 (two) times daily. 07/18/18 10/16/18  Lennette Bihari, MD  valsartan (DIOVAN) 320 MG tablet Take 320 mg by mouth daily. 02/01/18   [provider]  Vitamin D, Ergocalciferol, (DRISDOL) 1.25 MG (50000 UT) CAPS capsule Take 1 capsule (50,000 Units total) by mouth every 7 (seven) days. 05/17/18   Dhungel, Theda Belfast, MD      Critical care time: 120 minutes.  The treatment and management of the patient's condition was required based on the threat of imminent deterioration. This time reflects time spent by the physician evaluating, providing care and managing the critically ill patient's care. The time was spent at the immediate bedside (or on the same floor/unit and dedicated to this patient's care). Time involved in separately billable procedures is NOT included int he critical care time indicated above. Family meeting and update time may be included above if and only if the patient is unable/incompetent to participate in clinical interview and/or decision making, and the discussion was necessary to determining treatment decisions.   Marcelle Smiling, MD Board Certified by the ABIM, Pulmonary Diseases & Critical Care Medicine  [facility]  Pulmonary/Critical Care Critical Care Pager: [phone]

## 2018-08-01 ENCOUNTER — Inpatient Hospital Stay (HOSPITAL_COMMUNITY): Payer: Medicare Other

## 2018-08-01 DIAGNOSIS — L8990 Pressure ulcer of unspecified site, unspecified stage: Secondary | ICD-10-CM

## 2018-08-01 LAB — RESPIRATORY PANEL BY PCR
Adenovirus: NOT DETECTED
Bordetella pertussis: NOT DETECTED
CORONAVIRUS 229E-RVPPCR: NOT DETECTED
Chlamydophila pneumoniae: NOT DETECTED
Coronavirus HKU1: NOT DETECTED
Coronavirus NL63: NOT DETECTED
Coronavirus OC43: NOT DETECTED
INFLUENZA A-RVPPCR: NOT DETECTED
INFLUENZA B-RVPPCR: NOT DETECTED
Metapneumovirus: NOT DETECTED
Mycoplasma pneumoniae: NOT DETECTED
Parainfluenza Virus 1: NOT DETECTED
Parainfluenza Virus 2: NOT DETECTED
Parainfluenza Virus 3: NOT DETECTED
Parainfluenza Virus 4: NOT DETECTED
Respiratory Syncytial Virus: NOT DETECTED
Rhinovirus / Enterovirus: NOT DETECTED

## 2018-08-01 LAB — GLUCOSE, CAPILLARY
GLUCOSE-CAPILLARY: 74 mg/dL (ref 70–99)
Glucose-Capillary: 80 mg/dL (ref 70–99)
Glucose-Capillary: 112 mg/dL — ABNORMAL HIGH (ref 70–99)
Glucose-Capillary: 125 mg/dL — ABNORMAL HIGH (ref 70–99)
Glucose-Capillary: 137 mg/dL — ABNORMAL HIGH (ref 70–99)
Glucose-Capillary: 135 mg/dL — ABNORMAL HIGH (ref 70–99)
Glucose-Capillary: 117 mg/dL — ABNORMAL HIGH (ref 70–99)
Glucose-Capillary: 128 mg/dL — ABNORMAL HIGH (ref 70–99)

## 2018-08-01 LAB — MRSA PCR SCREENING: MRSA BY PCR: NEGATIVE

## 2018-08-01 LAB — URINE CULTURE: Culture: NO GROWTH

## 2018-08-01 LAB — PROCALCITONIN: Procalcitonin: 1.96 ng/mL

## 2018-08-01 MED ORDER — LEVETIRACETAM IN NACL 1500 MG/100ML IV SOLN
1500.0000 mg | Freq: Once | INTRAVENOUS | Status: AC
  Administered 2018-08-01: 1500 mg via INTRAVENOUS
  Filled 2018-08-01: qty 100

## 2018-08-01 MED ORDER — DEXTROSE 10 % IV SOLN
INTRAVENOUS | Status: DC
  Administered 2018-08-01 – 2018-08-03 (×4): via INTRAVENOUS

## 2018-08-01 MED ORDER — LEVETIRACETAM IN NACL 500 MG/100ML IV SOLN
500.0000 mg | Freq: Two times a day (BID) | INTRAVENOUS | Status: DC
  Administered 2018-08-01: 500 mg via INTRAVENOUS
  Filled 2018-08-01 (×2): qty 100

## 2018-08-01 MED ORDER — PRO-STAT SUGAR FREE PO LIQD
30.0000 mL | Freq: Three times a day (TID) | ORAL | Status: DC
  Administered 2018-08-01 – 2018-08-03 (×2): 30 mL
  Filled 2018-08-01 (×3): qty 30

## 2018-08-01 MED ORDER — MAGNESIUM SULFATE 2 GM/50ML IV SOLN
2.0000 g | Freq: Once | INTRAVENOUS | Status: AC
  Administered 2018-08-01: 2 g via INTRAVENOUS
  Filled 2018-08-01: qty 50

## 2018-08-01 MED ORDER — ADULT MULTIVITAMIN W/MINERALS CH
1.0000 | ORAL_TABLET | Freq: Every day | ORAL | Status: DC
  Administered 2018-08-01 – 2018-08-03 (×2): 1
  Filled 2018-08-01 (×3): qty 1

## 2018-08-01 MED ORDER — VITAL HIGH PROTEIN PO LIQD
1000.0000 mL | ORAL | Status: DC
  Administered 2018-08-01 (×2): 1000 mL

## 2018-08-01 NOTE — Progress Notes (Signed)
NAME:  Melinda Mann MRN:  [id number] DOB:  [birth year] LOS: 1 ADMISSION DATE:  August 28, 2018 DATE OF SERVICE:  07/31/2018  CHIEF COMPLAINT:  Cardiac arrest/asystole   HISTORY & PHYSICAL  History of Present Illness  This 78 y.o. Caucasian female non-smoker presented to the [facility] Emergency Department via [facility] with complaints of cardiac arrest.  The patient apparently had a witnessed arrest at home.  Initial rhythm was reported to be asystole.  Reportedly, ROSC was achieved with 5 minutes of CPR with 2 amps epinephrine administered.  She was subsequently in a junctional rhythm, prompting external pacing.  Currently, she is off of the external pacer and is in a sinus rhythm.  At the time of clinical interview, the patient is in airborne and contact isolation for possible COVID-19.  This was apparently prompted by family members report that the patient has been having fever, cough and severe diarrhea.  Antimicrobials:  N/A  Interim history/subjective:  N/A  Objective   BP (!) 139/52   Pulse 80   Temp 98.5 F (36.9 C) (Axillary)   Resp (!) 21   Ht  (1.702 m) Comment: measured by RT  Wt 89.5 kg   SpO2 99%   BMI 30.90 kg/m  CVP:  [4 mmHg-9 mmHg] 9 mmHg  Filed Weights   07/31/18 0500 08/01/18 0452  Weight: 88.2 kg 89.5 kg    Intake/Output Summary (Last 24 hours) at 08/01/2018 1020 Last data filed at 08/01/2018 1000 Gross per 24 hour  Intake 1595.65 ml  Output 840 ml  Net 755.65 ml    Vent Mode: PRVC FiO2 (%):  [40 %] 40 % Set Rate:  [24 bmp] 24 bmp Vt Set:  [490 mL] 490 mL PEEP:  [5 cmH20] 5 cmH20 Plateau Pressure:  [15 cmH20-18 cmH20] 16 cmH20   Examination: General: Acutely ill appearing female, NAD, unresponsive  HEENT: [place]/AT, PERRL, EOM-I and MMM Heart: RRR, Nl S1/S2 and -M/R/G Lung: Diffuse crackles Abdomen: Soft, NT, ND and +BS Ext: -edema and -tenderness Skin: Intact  Resolved Hospital Problem list      Assessment & Plan:    Acute respiratory failure with hypoxemia: - Full vent support - Titrate FiO2 and PEEP to 100 and 18 for sat of 88-92% - COVID test pending - Adjust vent for ABG - F/U CXR and ABG  Cardiac arrest: - Cardiology consult if patient's neuro status improve - Tele monitoring - Pressors for BP support  Acute kidney injury:  - BMET in AM - Replace electrolytes as indicated - KVO IVF  Diarrhea: - C diff negative - Monitor clinically  Infectious disease: - CAP coverage  Neuro: anoxic encephalopathy - Start Keppra - Neurology consulted - MRI of the brain once COVID is resulted and negative - Propofol and fentanyl  PCCM will continue to manage  ASSESSMENT/PLAN:  ASSESSMENT (included in the Hospital Problem List)  Principal Problem:   Acute hypercapnic respiratory failure (HCC) Active Problems:   Acute decompensated heart failure (HCC)   Acute kidney failure (HCC)   Hyperkalemia   Lactic acidosis   Diarrhea   Leukocytosis   Macrocytic anemia   Anoxic encephalopathy (HCC)   Myoclonus   Pressure injury of skin   By systems: PULMONARY  Acute hypercapnic respiratory failure. CTA chest to rule out pulmonary embolism ABG now. Vent settings will be titrated based on ABG results. Trach aspirate for Gram stain, C/S. Empiric Rocephin/azithromcyin for now.   CARDIOVASCULAR  Acute decompensated CHF, baseline LVEF  40-45%  Asystole with ROSC after CPR 2D echo   RENAL  Acute kidney injury  Hyperkalemia  Anion gap metabolic acidosis/lactic acidosis Consider starting bicarbonate infusion.   GASTROINTESTINAL  Diarrhea, likely infectious  GI PROPHYLAXIS: Protonix C diff pending. Stool for PCR. Low threshold to discontinue empiric antibiotics (with ongoing diarrhea) in the event that trach aspirate is unrevealing. U/A and UDS.   HEMATOLOGIC  Leukocytosis without lymphocytosis or monocytosis  Macrocytic anemia, mild  DVT PROPHYLAXIS: Heparin if CT head  negative for hemorrhage   INFECTIOUS  COVID-19 test pending Empiric antibiotics for community-acquired pneumonia.   ENDOCRINE  Type 2 diabetes mellitus with vascular complications Accuchecks. Sliding scale insulin.   NEUROLOGIC  Anoxic encephalopathy  Myoclonus  GCS 3T CT head without contrast to rule out stroke. May need EEG to differentiate seizure vs myoclonic jerks.   PLAN/RECOMMENDATIONS   Admit to ICU under my service (Attending: Marcelle Smiling, MD) with the diagnoses highlighted above in the active Hospital Problem List (ASSESSMENT).  PROGNOSIS: grim.     My assessment, plan of care, findings, medications, side effects, etc. were discussed with: nurse and respiratory therapist.   Best practice:  Diet: Clinical nutrition consult for tube feed recommendations Pain/Anxiety/Delirium protocol (if indicated): Propofol/fentanyl VAP protocol (if indicated): Yes DVT prophylaxis: Subcutaneous heparin GI prophylaxis: Protonix Glucose control: Accu-Cheks.  Sliding scale insulin. Mobility/Activity: Bedrest   Code Status: Full Code Family Communication:  No family at the bedside Disposition: Admit to ICU   Labs   CBC: Recent Labs  Lab 2018/08/06 2335 06-Aug-2018 2336 August 06, 2018 2343 07/31/18 0258  WBC 13.3*  --   --  9.9  NEUTROABS 6.6  --   --   --   HGB 11.5* 12.2 12.2 11.4*  HCT 41.5 36.0 36.0 38.9  MCV 102.5*  --   --  98.5  PLT 275  --   --  223    Basic Metabolic Panel: Recent Labs  Lab 2018/08/06 2335 2018/08/06 2336 08/06/18 2343 07/31/18 0258  NA 138 141 142 144  K 5.2* 5.5* 5.2* 4.4  CL 108  --   --  112*  CO2 15*  --   --  22  GLUCOSE 285*  --   --  163*  BUN 15  --   --  18  CREATININE 1.30*  --   --  1.13*  CALCIUM 9.0  --   --  8.6*  MG  --   --   --  1.8  PHOS  --   --   --  4.9*   GFR: Estimated Creatinine Clearance: 49.4 mL/min (A) (by C-G formula based on SCr of 1.13 mg/dL (H)). Recent Labs  Lab 08/06/2018 2335 07/31/18 0258 07/31/18  0325 08/01/18 0447  PROCALCITON  --  0.21  --  1.96  WBC 13.3* 9.9  --   --   LATICACIDVEN 9.2*  --  3.1*  --     Liver Function Tests: Recent Labs  Lab August 06, 2018 2335  AST 44*  ALT 22  ALKPHOS 100  BILITOT 0.5  PROT 6.1*  ALBUMIN 3.3*   No results for input(s): LIPASE, AMYLASE in the last 168 hours. No results for input(s): AMMONIA in the last 168 hours.  ABG    Component Value Date/Time   PHART 7.428 07/31/2018 0445   PCO2ART 39.0 07/31/2018 0445   PO2ART 71.1 (L) 07/31/2018 0445   HCO3 25.3 07/31/2018 0445   TCO2 18 (L) Aug 06, 2018 2343   ACIDBASEDEF 16.0 (H) 08/06/18 2343  O2SAT 92.2 07/31/2018 0445     Coagulation Profile: Recent Labs  Lab 2018/08/09 2335  INR 1.2    Cardiac Enzymes: No results for input(s): CKTOTAL, CKMB, CKMBINDEX, TROPONINI in the last 168 hours.  HbA1C: Hgb A1c MFr Bld  Date/Time Value Ref Range Status  07/31/2018 02:58 AM 6.3 (H) 4.8 - 5.6 % Final    Comment:    (NOTE) Pre diabetes:          5.7%-6.4% Diabetes:              >6.4% Glycemic control for   <7.0% adults with diabetes   06/12/2018 06:57 AM 6.5 (H) 4.8 - 5.6 % Final    Comment:    (NOTE) Pre diabetes:          5.7%-6.4% Diabetes:              >6.4% Glycemic control for   <7.0% adults with diabetes     CBG: Recent Labs  Lab 07/31/18 1504 07/31/18 1715 07/31/18 2046 08/01/18 0024 08/01/18 0450  GLUCAP 80 94 80 74 112*    The patient is critically ill with multiple organ systems failure and requires high complexity decision making for assessment and support, frequent evaluation and titration of therapies, application of advanced monitoring technologies and extensive interpretation of multiple databases.   Critical Care Time devoted to patient care services described in this note is  35  Minutes. This time reflects time of care of this signee Dr Koren Bound. This critical care time does not reflect procedure time, or teaching time or supervisory time of  PA/NP/Med student/Med Resident etc but could involve care discussion time.  Alyson Reedy, M.D. [facility] Pulmonary/Critical Care Medicine. Pager: [phone]. After hours pager: [phone].

## 2018-08-01 NOTE — Progress Notes (Signed)
eLink Physician-Brief Progress Note Patient Name: Melinda Mann DOB: 07-Sep-1940 MRN: [id number]   Date of Service  08/01/2018  HPI/Events of Note  Notified of hypoglycemia 80s to 90s. COVID PUI  eICU Interventions  Ordered to start D10 and hopefully minimize frequent checking as patient is COVID PUI     Intervention Category Major Interventions: Other:  [name] 08/01/2018, 12:56 AM

## 2018-08-01 NOTE — Progress Notes (Signed)
Patient's nephew called to get update on patient stating he is POA and will fax paper tomorrow.  Patient's nephew wants to address plan of care once POA paper receive.  RN to notify MD.

## 2018-08-01 NOTE — Progress Notes (Signed)
Pt vomited at 1430.  Tube feeds turn off.  MD notified.  RN will continue to monitor.

## 2018-08-01 NOTE — Progress Notes (Addendum)
Initial Nutrition Assessment RD working remotely.  DOCUMENTATION CODES:   Obesity unspecified  INTERVENTION:    Vital High Protein at 40 ml/h (960 ml per day)  Pro-stat 30 ml TID  Provides 1260 kcal, 129 gm protein, 803 ml free water daily  NUTRITION DIAGNOSIS:   Inadequate oral intake related to inability to eat as evidenced by NPO status.  GOAL:   Provide needs based on ASPEN/SCCM guidelines  MONITOR:   Vent status, TF tolerance, Labs, Skin, I & O's  REASON FOR ASSESSMENT:   Ventilator, Consult Enteral/tube feeding initiation and management  ASSESSMENT:   78 yo female with PMH of HTN, TIA, DM who was admitted s/p witnessed cardiac arrest at home. COVID-19 test results pending; recent fever, cough, and severe diarrhea.   Received MD Consult for TF initiation and management. OGT in place.  Patient is currently intubated on ventilator support MV: 10.5 L/min Temp (24hrs), Avg:98.3 F (36.8 C), Min:98.1 F (36.7 C), Max:98.5 F (36.9 C)  Propofol: currently off    Labs reviewed. Phosphorus 4.9 (H), creatinine 1.13 (H) CBG's: 74-112 Medications reviewed and include Novolog, Fentanyl, Levophed, Propofol.  Neurology following for suspected myoclonus. C. diff and COVID-19 test results pending.  NUTRITION - FOCUSED PHYSICAL EXAM:  unable to complete  Diet Order:   Diet Order            Diet NPO time specified  Diet effective now              EDUCATION NEEDS:   No education needs have been identified at this time  Skin:  Skin Assessment: Skin Integrity Issues: Skin Integrity Issues:: Stage II Stage II: anus  Last BM:  3/29 (type 7)  Height:   Ht Readings from Last 5 Encounters:  07/08/2018 5\' 7"  (1.702 m)  07/18/18 5\' 6"  (1.676 m)  06/11/18 5\' 6"  (1.676 m)  05/15/18 5\' 6"  (1.676 m)  04/04/18 5\' 6"  (1.676 m)    Weight:   Wt Readings from Last 1 Encounters:  08/01/18 89.5 kg    Ideal Body Weight:  61.4 kg  BMI:  Body mass index is  30.9 kg/m.  Estimated Nutritional Needs:   Kcal:  1000-1250  Protein:  123 gm  Fluid:  1.5 L    Joaquin Courts, RD, LDN, CNSC Pager [phone] After Hours Pager [phone]

## 2018-08-02 ENCOUNTER — Inpatient Hospital Stay (HOSPITAL_COMMUNITY): Payer: Medicare Other

## 2018-08-02 DIAGNOSIS — Z515 Encounter for palliative care: Secondary | ICD-10-CM

## 2018-08-02 DIAGNOSIS — Z7189 Other specified counseling: Secondary | ICD-10-CM

## 2018-08-02 LAB — CULTURE, RESPIRATORY W GRAM STAIN: Culture: NORMAL

## 2018-08-02 LAB — POCT I-STAT 7, (LYTES, BLD GAS, ICA,H+H)
Acid-Base Excess: 3 mmol/L — ABNORMAL HIGH (ref 0.0–2.0)
Bicarbonate: 25.1 mmol/L (ref 20.0–28.0)
Calcium, Ion: 1.11 mmol/L — ABNORMAL LOW (ref 1.15–1.40)
HCT: 31 % — ABNORMAL LOW (ref 36.0–46.0)
HEMOGLOBIN: 10.5 g/dL — AB (ref 12.0–15.0)
O2 Saturation: 98 %
PCO2 ART: 28 mmHg — AB (ref 32.0–48.0)
Patient temperature: 98.1
Potassium: 2.9 mmol/L — ABNORMAL LOW (ref 3.5–5.1)
Sodium: 144 mmol/L (ref 135–145)
TCO2: 26 mmol/L (ref 22–32)
pH, Arterial: 7.56 — ABNORMAL HIGH (ref 7.350–7.450)
pO2, Arterial: 92 mmHg (ref 83.0–108.0)

## 2018-08-02 LAB — CBC
HCT: 28.6 % — ABNORMAL LOW (ref 36.0–46.0)
Hemoglobin: 9 g/dL — ABNORMAL LOW (ref 12.0–15.0)
MCH: 28.4 pg (ref 26.0–34.0)
MCHC: 31.5 g/dL (ref 30.0–36.0)
MCV: 90.2 fL (ref 80.0–100.0)
Platelets: 180 10*3/uL (ref 150–400)
RBC: 3.17 MIL/uL — ABNORMAL LOW (ref 3.87–5.11)
RDW: 15.2 % (ref 11.5–15.5)
WBC: 8.7 10*3/uL (ref 4.0–10.5)
nRBC: 0 % (ref 0.0–0.2)

## 2018-08-02 LAB — BASIC METABOLIC PANEL
ANION GAP: 8 (ref 5–15)
BUN: 25 mg/dL — ABNORMAL HIGH (ref 8–23)
CO2: 25 mmol/L (ref 22–32)
Calcium: 8.2 mg/dL — ABNORMAL LOW (ref 8.9–10.3)
Chloride: 107 mmol/L (ref 98–111)
Creatinine, Ser: 1.34 mg/dL — ABNORMAL HIGH (ref 0.44–1.00)
GFR calc Af Amer: 45 mL/min — ABNORMAL LOW (ref >60)
GFR calc non Af Amer: 39 mL/min — ABNORMAL LOW (ref >60)
GLUCOSE: 132 mg/dL — AB (ref 70–99)
Potassium: 2.9 mmol/L — ABNORMAL LOW (ref 3.5–5.1)
SODIUM: 140 mmol/L (ref 135–145)

## 2018-08-02 LAB — CULTURE, RESPIRATORY

## 2018-08-02 LAB — GLUCOSE, CAPILLARY
GLUCOSE-CAPILLARY: 141 mg/dL — AB (ref 70–99)
GLUCOSE-CAPILLARY: 139 mg/dL — AB (ref 70–99)
Glucose-Capillary: 125 mg/dL — ABNORMAL HIGH (ref 70–99)
Glucose-Capillary: 151 mg/dL — ABNORMAL HIGH (ref 70–99)
Glucose-Capillary: 151 mg/dL — ABNORMAL HIGH (ref 70–99)

## 2018-08-02 LAB — PROCALCITONIN: PROCALCITONIN: 0.79 ng/mL

## 2018-08-02 LAB — NOVEL CORONAVIRUS, NAA (HOSP ORDER, SEND-OUT TO REF LAB; TAT 18-24 HRS): SARS-CoV-2, NAA: NOT DETECTED

## 2018-08-02 LAB — PHOSPHORUS: Phosphorus: 3.1 mg/dL (ref 2.5–4.6)

## 2018-08-02 LAB — MAGNESIUM: Magnesium: 2.2 mg/dL (ref 1.7–2.4)

## 2018-08-02 MED ORDER — FENTANYL CITRATE (PF) 100 MCG/2ML IJ SOLN
25.0000 ug | INTRAMUSCULAR | Status: DC | PRN
  Administered 2018-08-02 – 2018-08-03 (×7): 50 ug via INTRAVENOUS
  Filled 2018-08-02 (×7): qty 2

## 2018-08-02 MED ORDER — PANTOPRAZOLE SODIUM 40 MG PO PACK
40.0000 mg | PACK | Freq: Every day | ORAL | Status: DC
  Administered 2018-08-03: 40 mg
  Filled 2018-08-02 (×2): qty 20

## 2018-08-02 MED ORDER — LEVETIRACETAM 100 MG/ML PO SOLN
500.0000 mg | Freq: Two times a day (BID) | ORAL | Status: DC
  Administered 2018-08-02 – 2018-08-03 (×3): 500 mg
  Filled 2018-08-02 (×4): qty 5

## 2018-08-02 MED ORDER — POTASSIUM CHLORIDE 10 MEQ/50ML IV SOLN
10.0000 meq | INTRAVENOUS | Status: AC
  Administered 2018-08-02 (×5): 10 meq via INTRAVENOUS
  Filled 2018-08-02 (×5): qty 50

## 2018-08-02 MED ORDER — VALPROIC ACID 250 MG/5ML PO SOLN
500.0000 mg | Freq: Two times a day (BID) | ORAL | Status: DC
  Administered 2018-08-02 – 2018-08-03 (×3): 500 mg
  Filled 2018-08-02 (×4): qty 10

## 2018-08-02 NOTE — Progress Notes (Signed)
Nephew called back, he will be able to get to the bank at 2 PM tomorrow to get paper work.  He thinks that comfort measures is what Melinda Mann would want but would like to be sure.  Asked him to speak to daughter and it is fine to wait til tomorrow but plan to withdraw in AM.  Informed them in the meantime, no escalation of care and DNR status and he was agreeable to that.  Alyson Reedy, M.D. [facility] Pulmonary/Critical Care Medicine. Pager: [phone]. After hours pager: [phone]

## 2018-08-02 NOTE — Progress Notes (Addendum)
CCM communicated with family over phone regarding plan of care.  Family will arrive at 1400 and will withdraw care at that time.  RT notified of plan of care.  RN will continue to monitor.    Family called back 75 and spoke with CCM.  Plan to withdraw care tomorrow, August 03, 2018.  MD discuss code status with family and patient will be DNR.

## 2018-08-02 NOTE — Progress Notes (Signed)
150 ml Fentanyl wasted in hazardous bin with Gerre Scull., RN.

## 2018-08-02 NOTE — Progress Notes (Signed)
NAME:  Melinda Mann MRN:  [id number] DOB:  [birth year] LOS: 2 ADMISSION DATE:  07/21/2018 DATE OF SERVICE:  07/31/2018  CHIEF COMPLAINT:  Cardiac arrest/asystole   HISTORY & PHYSICAL  History of Present Illness  This 78 y.o. Caucasian female non-smoker presented to the [facility] Emergency Department via [facility] with complaints of cardiac arrest.  The patient apparently had a witnessed arrest at home.  Initial rhythm was reported to be asystole.  Reportedly, ROSC was achieved with 5 minutes of CPR with 2 amps epinephrine administered.  She was subsequently in a junctional rhythm, prompting external pacing.  Currently, she is off of the external pacer and is in a sinus rhythm.  At the time of clinical interview, the patient is in airborne and contact isolation for possible COVID-19.  This was apparently prompted by family members report that the patient has been having fever, cough and severe diarrhea.  Antimicrobials:  N/A  Interim history/subjective:  Vomiting after TF were started overnight, now off  Objective   BP (!) 141/62 (BP Location: Left Arm)   Pulse 79   Temp 98.1 F (36.7 C) (Axillary)   Resp (!) 24   Ht  (1.702 m) Comment: measured by RT  Wt 87.5 kg   SpO2 100%   BMI 30.21 kg/m     Filed Weights   07/31/18 0500 08/01/18 0452 08/02/18 0311  Weight: 88.2 kg 89.5 kg 87.5 kg    Intake/Output Summary (Last 24 hours) at 08/02/2018 4098 Last data filed at 08/02/2018 0900 Gross per 24 hour  Intake 2341.18 ml  Output 1555 ml  Net 786.18 ml   Vent Mode: PRVC FiO2 (%):  [40 %] 40 % Set Rate:  [24 bmp] 24 bmp Vt Set:  [490 mL] 490 mL PEEP:  [5 cmH20] 5 cmH20 Plateau Pressure:  [18 cmH20-19 cmH20] 19 cmH20   Examination: General: Acutely ill appearing female, NAD, remains unresponsive HEENT: [place]/AT, pupils sluggish, EOM-spontaneous Heart: RRR, Nl S1/S2 and -M/R/G Lung: Diffuse crackles Abdomen: Soft, NT, ND and +BS Ext: -edema and  -tenderness Skin: Intact  Resolved Hospital Problem list      Assessment & Plan:   Acute respiratory failure with hypoxemia: - Maintain on full vent support - FiO2 of 40% and 5 of PEEP but mental status precludes extubation - COVID test pending - Adjust vent for ABG - F/U CXR and ABG  Cardiac arrest: - Cardiology consult if patient's neuro status improve - Tele monitoring - Pressors for BP support - Need to decide on code status  Acute kidney injury:  - BMET in AM - Replace electrolytes as indicated - KVO IVF  Diarrhea: - C diff negative - Monitor clinically  Infectious disease: - CAP coverage  Neuro: anoxic encephalopathy - Start Keppra - Neurology consulted - MRI of the brain once COVID is resulted and negative - D/C propofol and fentanyl  Spoke with daughter, wants comfort care.  Nephew called and claimed to be POA but paper work is in the bank and he wants to give her more time.  I asked him to get that paper work here by tomorrow.  It appears that they are both in agreement that the patient would not want this level of care.  I asked them that they communicate and then inform us.   Marquita Palms called back 30 minutes later to inform us that he spoke with Jearl Klinefelter (daughter) and they are ready for withdrawal.  I called Lawson Fiscal  to confirm and she confirmed that the patient would not want this aggressive care and that they would like to transition to comfort care.  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  They were clear that unless she can go back to normal she would not want to live this way and that she has stated in the past she did not want aggressive care.  They are to arrive here at 2 PM and will be ready for extubation.  Will place order for withdrawal with RN to release when the family is here.  Best practice:  Diet: Clinical nutrition consult for tube feed recommendations Pain/Anxiety/Delirium protocol (if indicated):  Propofol/fentanyl VAP protocol (if indicated): Yes DVT prophylaxis: Subcutaneous heparin GI prophylaxis: Protonix Glucose control: Accu-Cheks.  Sliding scale insulin. Mobility/Activity: Bedrest   Code Status: Full Code Family Communication:  No family at the bedside Disposition: Admit to ICU   Labs   CBC: Recent Labs  Lab 07/26/2018 2335 07/15/2018 2336 07/27/2018 2343 07/31/18 0258 08/02/18 0201 08/02/18 0343  WBC 13.3*  --   --  9.9 8.7  --   NEUTROABS 6.6  --   --   --   --   --   HGB 11.5* 12.2 12.2 11.4* 9.0* 10.5*  HCT 41.5 36.0 36.0 38.9 28.6* 31.0*  MCV 102.5*  --   --  98.5 90.2  --   PLT 275  --   --  223 180  --     Basic Metabolic Panel: Recent Labs  Lab 08/01/2018 2335 07/24/2018 2336 07/12/2018 2343 07/31/18 0258 08/02/18 0201 08/02/18 0343  NA 138 141 142 144 140 144  K 5.2* 5.5* 5.2* 4.4 2.9* 2.9*  CL 108  --   --  112* 107  --   CO2 15*  --   --  22 25  --   GLUCOSE 285*  --   --  163* 132*  --   BUN 15  --   --  18 25*  --   CREATININE 1.30*  --   --  1.13* 1.34*  --   CALCIUM 9.0  --   --  8.6* 8.2*  --   MG  --   --   --  1.8 2.2  --   PHOS  --   --   --  4.9* 3.1  --    GFR: Estimated Creatinine Clearance: 41.2 mL/min (A) (by C-G formula based on SCr of 1.34 mg/dL (H)). Recent Labs  Lab 07/23/2018 2335 07/31/18 0258 07/31/18 0325 08/01/18 0447 08/02/18 0201  PROCALCITON  --  0.21  --  1.96 0.79  WBC 13.3* 9.9  --   --  8.7  LATICACIDVEN 9.2*  --  3.1*  --   --     Liver Function Tests: Recent Labs  Lab 07/12/2018 2335  AST 44*  ALT 22  ALKPHOS 100  BILITOT 0.5  PROT 6.1*  ALBUMIN 3.3*   No results for input(s): LIPASE, AMYLASE in the last 168 hours. No results for input(s): AMMONIA in the last 168 hours.  ABG    Component Value Date/Time   PHART 7.560 (H) 08/02/2018 0343   PCO2ART 28.0 (L) 08/02/2018 0343   PO2ART 92.0 08/02/2018 0343   HCO3 25.1 08/02/2018 0343   TCO2 26 08/02/2018 0343   ACIDBASEDEF 16.0 (H) 07/06/2018 2343    O2SAT 98.0 08/02/2018 0343     Coagulation Profile: Recent Labs  Lab 07/06/2018 2335  INR 1.2    Cardiac Enzymes: No results for input(s): CKTOTAL, CKMB, CKMBINDEX, TROPONINI in the last 168 hours.  HbA1C: Hgb A1c MFr Bld  Date/Time Value Ref Range Status  07/31/2018 02:58 AM 6.3 (H) 4.8 - 5.6 % Final    Comment:    (NOTE) Pre diabetes:          5.7%-6.4% Diabetes:              >6.4% Glycemic control for   <7.0% adults with diabetes   06/12/2018 06:57 AM 6.5 (H) 4.8 - 5.6 % Final    Comment:    (NOTE) Pre diabetes:          5.7%-6.4% Diabetes:              >6.4% Glycemic control for   <7.0% adults with diabetes     CBG: Recent Labs  Lab 08/01/18 1529 08/01/18 1954 08/01/18 2306 08/02/18 0303 08/02/18 0811  GLUCAP 135* 117* 128* 125* 151*    The patient is critically ill with multiple organ systems failure and requires high complexity decision making for assessment and support, frequent evaluation and titration of therapies, application of advanced monitoring technologies and extensive interpretation of multiple databases.   Critical Care Time devoted to patient care services described in this note is  90  Minutes. This time reflects time of care of this signee Dr Koren Bound. This critical care time does not reflect procedure time, or teaching time or supervisory time of PA/NP/Med student/Med Resident etc but could involve care discussion time.  Alyson Reedy, M.D. [facility] Pulmonary/Critical Care Medicine. Pager: [phone]. After hours pager: [phone].

## 2018-08-02 NOTE — Progress Notes (Signed)
eLink Physician-Brief Progress Note Patient Name: Melinda Mann DOB: 11-Jul-1940 MRN: [id number]   Date of Service  08/02/2018  HPI/Events of Note  K+ = 2.9 and Creatinine = 1.34.  eICU Interventions  Will replace K+.      Intervention Category Major Interventions: Electrolyte abnormality - evaluation and management  Sommer,Steven Eugene 08/02/2018, 5:19 AM

## 2018-08-02 NOTE — Progress Notes (Signed)
eLink Physician-Brief Progress Note Patient Name: TRIONA SEVERIN DOB: 08-Jan-1941 MRN: [id number]   Date of Service  08/02/2018  HPI/Events of Note  Request of sedation - patient intubated and ventilated.   eICU Interventions  Will order: 1. Fentanyl 25-50 mcg IV Q 2 hours PRN sedation/agitation.      Intervention Category Major Interventions: Other:  Lenell Antu 08/02/2018, 7:45 PM

## 2018-08-03 LAB — GLUCOSE, CAPILLARY
Glucose-Capillary: 132 mg/dL — ABNORMAL HIGH (ref 70–99)
Glucose-Capillary: 146 mg/dL — ABNORMAL HIGH (ref 70–99)
Glucose-Capillary: 149 mg/dL — ABNORMAL HIGH (ref 70–99)

## 2018-08-03 MED ORDER — CHLORHEXIDINE GLUCONATE CLOTH 2 % EX PADS
6.0000 | MEDICATED_PAD | Freq: Every day | CUTANEOUS | Status: DC
  Administered 2018-08-03: 06:00:00 6 via TOPICAL

## 2018-08-03 MED ORDER — ACETAMINOPHEN 650 MG RE SUPP: 650.0000 mg | Freq: Four times a day (QID) | RECTAL | Status: DC | PRN

## 2018-08-03 MED ORDER — DEXTROSE 5 % IV SOLN: INTRAVENOUS | Status: DC

## 2018-08-03 MED ORDER — GLYCOPYRROLATE 1 MG PO TABS: 1.0000 mg | ORAL_TABLET | ORAL | Status: DC | PRN

## 2018-08-03 MED ORDER — MORPHINE BOLUS VIA INFUSION
5.0000 mg | INTRAVENOUS | Status: DC | PRN
  Administered 2018-08-03 (×5): 5 mg via INTRAVENOUS
  Filled 2018-08-03: qty 5

## 2018-08-03 MED ORDER — MORPHINE SULFATE (PF) 2 MG/ML IV SOLN
2.0000 mg | INTRAVENOUS | Status: DC | PRN
  Administered 2018-08-03: 2 mg via INTRAVENOUS
  Filled 2018-08-03: qty 1

## 2018-08-03 MED ORDER — POLYVINYL ALCOHOL 1.4 % OP SOLN
1.0000 [drp] | Freq: Four times a day (QID) | OPHTHALMIC | Status: DC | PRN
  Filled 2018-08-03: qty 15

## 2018-08-03 MED ORDER — GLYCOPYRROLATE 0.2 MG/ML IJ SOLN: 0.2000 mg | INTRAMUSCULAR | Status: DC | PRN

## 2018-08-03 MED ORDER — MORPHINE 100MG IN NS 100ML (1MG/ML) PREMIX INFUSION
0.0000 mg/h | INTRAVENOUS | Status: DC
  Administered 2018-08-03: 10 mg/h via INTRAVENOUS
  Filled 2018-08-03: qty 100

## 2018-08-03 MED ORDER — GLYCOPYRROLATE 0.2 MG/ML IJ SOLN
0.2000 mg | INTRAMUSCULAR | Status: DC | PRN
  Administered 2018-08-03: 14:00:00 0.2 mg via INTRAVENOUS
  Filled 2018-08-03: qty 1

## 2018-08-03 MED ORDER — ACETAMINOPHEN 325 MG PO TABS: 650.0000 mg | ORAL_TABLET | Freq: Four times a day (QID) | ORAL | Status: DC | PRN

## 2018-08-03 MED ORDER — DIPHENHYDRAMINE HCL 50 MG/ML IJ SOLN: 25.0000 mg | INTRAMUSCULAR | Status: DC | PRN

## 2018-08-03 DEATH — deceased

## 2018-08-05 LAB — CULTURE, BLOOD (ROUTINE X 2)
CULTURE: NO GROWTH
Culture: NO GROWTH
Special Requests: ADEQUATE

## 2018-08-09 ENCOUNTER — Telehealth: Payer: Self-pay

## 2018-08-09 NOTE — Telephone Encounter (Signed)
On 08/09/2018 Received dc from [employer] (original). DC is for cremation. Patient is a patient of Doctor Molli Knock.   Dc will be taken to 2100 for signature.

## 2018-08-17 ENCOUNTER — Telehealth: Payer: Self-pay

## 2018-08-17 NOTE — Telephone Encounter (Signed)
Received dc back from [facility], I called funeral home to let them know dc is ready for pickup. I also faxed a copy to the funeral home per the funeral home request.

## 2018-09-02 NOTE — Progress Notes (Signed)
Patient extubated per the withdrawal of life order.  Patient extubated to room air.

## 2018-09-02 NOTE — Progress Notes (Signed)
30mg  IV Morphine wasted in sink, Witnessed by Ardith Dark, RN.

## 2018-09-02 NOTE — Progress Notes (Signed)
NAME:  Melinda Mann MRN:  [id number] DOB:  1940/05/11 LOS: 3 ADMISSION DATE:  08/21/2018 DATE OF SERVICE:  07/31/2018  CHIEF COMPLAINT:  Cardiac arrest/asystole   HISTORY & PHYSICAL  History of Present Illness  This 78 y.o. Caucasian female non-smoker presented to the [facility] Emergency Department via [facility] with complaints of cardiac arrest.  The patient apparently had a witnessed arrest at home.  Initial rhythm was reported to be asystole.  Reportedly, ROSC was achieved with 5 minutes of CPR with 2 amps epinephrine administered.  She was subsequently in a junctional rhythm, prompting external pacing.  Currently, she is off of the external pacer and is in a sinus rhythm.  At the time of clinical interview, the patient is in airborne and contact isolation for possible COVID-19.  This was apparently prompted by family members report that the patient has been having fever, cough and severe diarrhea.  Antimicrobials:  N/A  Interim history/subjective:  No events overnight, remains unresponsive  Objective   BP (!) 183/75   Pulse 83   Temp 99.5 F (37.5 C) (Axillary)   Resp (!) 24   Ht  (1.702 m) Comment: measured by RT  Wt 89.3 kg   SpO2 98%   BMI 30.83 kg/m     Filed Weights   08/01/18 0452 08/02/18 0311 08/06/2018 0352  Weight: 89.5 kg 87.5 kg 89.3 kg    Intake/Output Summary (Last 24 hours) at 08/05/2018 1143 Last data filed at 08/19/2018 1100 Gross per 24 hour  Intake 1576.06 ml  Output 1595 ml  Net -18.94 ml   Vent Mode: PRVC FiO2 (%):  [40 %] 40 % Set Rate:  [24 bmp] 24 bmp Vt Set:  [490 mL] 490 mL PEEP:  [5 cmH20] 5 cmH20 Plateau Pressure:  [12 cmH20-20 cmH20] 16 cmH20   Examination: General: Acutely ill appearing, unresponsive HEENT: [place]/AT, PERRL, EOM-I and MMM Heart: RRR, Nl S1/S2 and -M/R/G Lung: Diffuse crackles Abdomen: Soft, NT, ND and +BS Ext: -edema and -tenderness Skin: Intact  Resolved Hospital Problem list       Assessment & Plan:   Acute respiratory failure with hypoxemia: - Maintain on full vent support  - Maintain current FiO2 and PEEP - COVID pending - Adjust vent for ABG  Cardiac arrest: - Appreciate cardiology consult - Tele monitoring - Pressors for BP support - DNR per family - Family requesting withdrawal by daughter and nephew (claiming to be POA with paperwork that is not here) disagreeing.  Will let case management handel this at this point  Acute kidney injury:  - BMET in AM - Replace electrolytes as indicated - KVO IVF  Diarrhea: - C diff negative - Monitor clinically  Infectious disease: - Continue CAP coverage  Neuro: anoxic encephalopathy - Continue Keppra - Appreciate input from neurology - D/C MRI - D/C propofol and fentanyl - Morphine drip once ready for comfort, case management to determine who is to make decisions  See discussion with family from yesterday, will ask case manager to evaluate who is to make decision given all this family issues present.  Best practice:  Diet: Clinical nutrition consult for tube feed recommendations Pain/Anxiety/Delirium protocol (if indicated): Propofol/fentanyl VAP protocol (if indicated): Yes DVT prophylaxis: Subcutaneous heparin GI prophylaxis: Protonix Glucose control: Accu-Cheks.  Sliding scale insulin. Mobility/Activity: Bedrest   Code Status: DNR Family Communication:  No family at the bedside Disposition: Admit to ICU   Labs   CBC: Recent Labs  Lab  07/03/2018 2335 07/19/2018 2336 08/02/2018 2343 07/31/18 0258 08/02/18 0201 08/02/18 0343  WBC 13.3*  --   --  9.9 8.7  --   NEUTROABS 6.6  --   --   --   --   --   HGB 11.5* 12.2 12.2 11.4* 9.0* 10.5*  HCT 41.5 36.0 36.0 38.9 28.6* 31.0*  MCV 102.5*  --   --  98.5 90.2  --   PLT 275  --   --  223 180  --     Basic Metabolic Panel: Recent Labs  Lab 07/21/2018 2335 07/31/2018 2336 07/20/2018 2343 07/31/18 0258 08/02/18 0201 08/02/18 0343  NA 138 141 142  144 140 144  K 5.2* 5.5* 5.2* 4.4 2.9* 2.9*  CL 108  --   --  112* 107  --   CO2 15*  --   --  22 25  --   GLUCOSE 285*  --   --  163* 132*  --   BUN 15  --   --  18 25*  --   CREATININE 1.30*  --   --  1.13* 1.34*  --   CALCIUM 9.0  --   --  8.6* 8.2*  --   MG  --   --   --  1.8 2.2  --   PHOS  --   --   --  4.9* 3.1  --    GFR: Estimated Creatinine Clearance: 41.6 mL/min (A) (by C-G formula based on SCr of 1.34 mg/dL (H)). Recent Labs  Lab 07/16/2018 2335 07/31/18 0258 07/31/18 0325 08/01/18 0447 08/02/18 0201  PROCALCITON  --  0.21  --  1.96 0.79  WBC 13.3* 9.9  --   --  8.7  LATICACIDVEN 9.2*  --  3.1*  --   --     Liver Function Tests: Recent Labs  Lab 07/05/2018 2335  AST 44*  ALT 22  ALKPHOS 100  BILITOT 0.5  PROT 6.1*  ALBUMIN 3.3*   No results for input(s): LIPASE, AMYLASE in the last 168 hours. No results for input(s): AMMONIA in the last 168 hours.  ABG    Component Value Date/Time   PHART 7.560 (H) 08/02/2018 0343   PCO2ART 28.0 (L) 08/02/2018 0343   PO2ART 92.0 08/02/2018 0343   HCO3 25.1 08/02/2018 0343   TCO2 26 08/02/2018 0343   ACIDBASEDEF 16.0 (H) 07/03/2018 2343   O2SAT 98.0 08/02/2018 0343     Coagulation Profile: Recent Labs  Lab 07/07/2018 2335  INR 1.2    Cardiac Enzymes: No results for input(s): CKTOTAL, CKMB, CKMBINDEX, TROPONINI in the last 168 hours.  HbA1C: Hgb A1c MFr Bld  Date/Time Value Ref Range Status  07/31/2018 02:58 AM 6.3 (H) 4.8 - 5.6 % Final    Comment:    (NOTE) Pre diabetes:          5.7%-6.4% Diabetes:              >6.4% Glycemic control for   <7.0% adults with diabetes   06/12/2018 06:57 AM 6.5 (H) 4.8 - 5.6 % Final    Comment:    (NOTE) Pre diabetes:          5.7%-6.4% Diabetes:              >6.4% Glycemic control for   <7.0% adults with diabetes     CBG: Recent Labs  Lab 08/02/18 1519 08/02/18 1951 01-Sep-2018 0346 2018-09-01 0808 01-Sep-2018 1112  GLUCAP 141* 139* 132* 146* 149*  The patient  is critically ill with multiple organ systems failure and requires high complexity decision making for assessment and support, frequent evaluation and titration of therapies, application of advanced monitoring technologies and extensive interpretation of multiple databases.   Critical Care Time devoted to patient care services described in this note is  36  Minutes. This time reflects time of care of this signee Dr Koren Bound. This critical care time does not reflect procedure time, or teaching time or supervisory time of PA/NP/Med student/Med Resident etc but could involve care discussion time.  Alyson Reedy, M.D. [facility] Pulmonary/Critical Care Medicine. Pager: [phone]. After hours pager: [phone].

## 2018-09-02 NOTE — Progress Notes (Signed)
Daughter called to reschedule withdrawing patient on April 1st at 2 PM due to trying to get paper work together. She stated that she is disabled and has no car to get around. She stated that she is the next of kent and only her is to be contacted about her the care of her mother.  She stated Bonne Dolores the nephew, "should have nothing to do with her care". She also mentioned that she has been in contact with patient's brother Yolanda Manges, who lives in [place] but he is 76 years of age, and he also has been having difficulties collecting the necessary paper work.

## 2018-09-02 NOTE — Clinical Social Work Note (Signed)
Clinical Social Worker received call from patient RN regarding potential power of attorney and decision making.  CSW contacted patient nephew who had stated that he had power of attorney and could provide paperwork.  CSW spoke with nephew, Casimiro Needle, over the phone who states that he believes he is Engineer, agricultural and the paperwork is currently located at the [employer] in [place].  Patient nephew lives 3 hours away and is unable to obtain the document to verify.  Patient nephew kindly stated that he is in agreement with any decision that patient daughter makes regarding withdraw of patient care and is understanding of his inability to be present at the time of withdraw.  Patient nephew is supportive of patient daughter and sisters regarding healthcare decisions and will further communicate with them about potential needs following patient death.  CSW relayed message to RN.  CSW available for support as needed.  Macario Golds, [place] [phone]

## 2018-09-02 NOTE — Death Summary Note (Signed)
DEATH SUMMARY   Patient Details  Name: Melinda Mann MRN: [id number] DOB: [birth year]  Admission/Discharge Information   Admit Date:  08-07-2018  Date of Death: Date of Death: 2018-08-11  Time of Death: Time of Death: 09-09-1550  Length of Stay: 3  Referring Physician: Irena Reichmann, DO   Reason(s) for Hospitalization  Cardiac arrest  Diagnoses  Preliminary cause of death:   Pulseless electrical activity cardiac arrest  Secondary Diagnoses (including complications and co-morbidities):  Principal Problem:   Acute hypercapnic respiratory failure (HCC) Active Problems:   Acute decompensated heart failure (HCC)   Acute kidney failure (HCC)   Hyperkalemia   Lactic acidosis   Diarrhea   Leukocytosis   Macrocytic anemia   Anoxic encephalopathy (HCC)   Myoclonus   Pressure injury of skin   Brief Hospital Course (including significant findings, care, treatment, and services provided and events leading to death)  78 y.o. Caucasian female non-smoker presented to the [facility] Emergency Department via [facility] with complaints of cardiac arrest.  The patient apparently had a witnessed arrest at home.  Initial rhythm was reported to be asystole.  Reportedly, ROSC was achieved with 5 minutes of CPR with 2 amps epinephrine administered.  She was subsequently in a junctional rhythm, prompting external pacing.  Currently, she is off of the external pacer and is in a sinus rhythm.  Spoke with both nephew and daughter.  After discussion, they informed us that patient would not want this level of care and would like to change to comfort measures only.  Morphine drip started and patient was extubated to expire shortly thereafter.  Family notified by nursing staff.   Pertinent Labs and Studies  Significant Diagnostic Studies Ct Head Wo Contrast  Result Date: 07/31/2018 CLINICAL DATA:  Status post cardiac arrest EXAM: CT HEAD WITHOUT CONTRAST TECHNIQUE: Contiguous axial images were  obtained from the base of the skull through the vertex without intravenous contrast. COMPARISON:  05/15/2018 FINDINGS: Brain: No evidence of acute infarction, hemorrhage, hydrocephalus, extra-axial collection or mass lesion/mass effect. Mild subcortical white matter and periventricular small vessel ischemic changes. Vascular: No hyperdense vessel or unexpected calcification. Skull: Normal. Negative for fracture or focal lesion. Sinuses/Orbits: Partial opacification of the visualized paranasal sinuses. Mastoid air cells are clear. Other: None. IMPRESSION: No evidence of acute intracranial abnormality. Mild small vessel ischemic changes. Electronically Signed   By: Charline Bills M.D.   On: 07/31/2018 04:29   Ct Angio Chest Pe W Or Wo Contrast  Result Date: 07/31/2018 CLINICAL DATA:  Post cardiac arrest, intubated EXAM: CT ANGIOGRAPHY CHEST WITH CONTRAST TECHNIQUE: Multidetector CT imaging of the chest was performed using the standard protocol during bolus administration of intravenous contrast. Multiplanar CT image reconstructions and MIPs were obtained to evaluate the vascular anatomy. CONTRAST:  75mL OMNIPAQUE IOHEXOL 350 MG/ML SOLN COMPARISON:  Chest radiograph dated 07/31/2018. CTA chest dated 06/10/2018. FINDINGS: Cardiovascular: Satisfactory opacification the bilateral pulmonary arteries to the segmental level. No evidence of pulmonary embolism. Study is not tailored for evaluation of the thoracic aorta. No evidence of thoracic aortic aneurysm. Atherosclerotic calcifications of the aortic arch. Cardiomegaly.  No pericardial effusion. Coronary atherosclerosis of the LAD. Mediastinum/Nodes: No suspicious mediastinal lymphadenopathy. 12 mm short axis low right paratracheal node, likely reactive. Visualized thyroid is unremarkable. Lungs/Pleura: Endotracheal tube terminates 4 cm above the carina. Moderate right and small left pleural effusions. Associated bilateral lower lobe opacities, likely atelectasis.  Mild patchy/ground-glass opacities in the posterior upper lobes (series 9/image 23), with  interlobular septal thickening, possibly reflecting interstitial edema. No suspicious pulmonary nodules. No pneumothorax. Upper Abdomen: Visualized upper abdomen is notable for an enteric tube (incompletely visualized), vascular calcifications, and exophytic right renal cysts measuring up to 3.4 cm (incompletely visualized). Musculoskeletal: Healing right lateral 5th and 6th rib fracture deformities. Degenerative changes of the visualized thoracolumbar spine. Review of the MIP images confirms the above findings. IMPRESSION: No evidence of pulmonary embolism. Cardiomegaly with suspected mild interstitial edema. Moderate right and small left pleural effusions. Associated lower lobe opacities, likely compressive atelectasis. Healing right lateral 5th and 6th rib fracture deformities. Aortic Atherosclerosis (ICD10-I70.0). Electronically Signed   By: Charline BillsSriyesh  Krishnan M.D.   On: 07/31/2018 04:36   Dg Chest Port 1 View  Result Date: 08/02/2018 CLINICAL DATA:  Check endotracheal tube placement EXAM: PORTABLE CHEST 1 VIEW COMPARISON:  08/01/2018 FINDINGS: Endotracheal tube, gastric catheter and left jugular central line are again seen and stable. Cardiac shadow is stable. Bilateral pleural effusions right greater than left are seen. Persistent bibasilar atelectatic changes are seen. Mild central vascular congestion is noted as well. IMPRESSION: Relative stable appearance of the chest when compared with the previous day. Bilateral effusions right greater than left and vascular congestion is seen. Electronically Signed   By: Alcide CleverMark  Lukens M.D.   On: 08/02/2018 07:41   Dg Chest Port 1 View  Result Date: 08/01/2018 CLINICAL DATA:  Patient admitted 08/02/2018 and cardiac arrest. EXAM: PORTABLE CHEST 1 VIEW COMPARISON:  CT chest 07/31/2018. Single-view of the chest 07/31/2018 and 07/29/2018. PA and lateral chest 05/15/2018. FINDINGS:  ETT, left IJ central venous catheter and NG tube are unchanged. Small to moderate pleural effusions, greater on the right, are again seen. Hazy opacity over the right lower chest has increased and is compatible with layering effusion and atelectasis. There is cardiomegaly and mild interstitial edema. No pneumothorax. No acute bony abnormality. IMPRESSION: Findings compatible with an increased small to moderate right pleural effusion and airspace disease. Cardiomegaly and mild interstitial edema. Electronically Signed   By: Drusilla Kannerhomas  Dalessio M.D.   On: 08/01/2018 07:43   Dg Chest Port 1 View  Result Date: 07/31/2018 CLINICAL DATA:  Central line placement EXAM: PORTABLE CHEST 1 VIEW COMPARISON:  07/31/2018 FINDINGS: Interval placement of left neck vascular catheter, tip positioned near the superior cavoatrial junction. Otherwise stable examination including endotracheal tube and gently looped esophagogastric tube, tip and side port below the diaphragm. Cardiomegaly. No focal airspace opacity. IMPRESSION: Interval placement of left neck vascular catheter, tip positioned near the superior cavoatrial junction. Otherwise stable examination including endotracheal tube and gently looped esophagogastric tube, tip and side port below the diaphragm. Cardiomegaly. No focal airspace opacity. Electronically Signed   By: Lauralyn PrimesAlex  Bibbey M.D.   On: 07/31/2018 02:45   Dg Chest Portable 1 View  Result Date: 07/31/2018 CLINICAL DATA:  Initial evaluation for endotracheal tube placement. EXAM: PORTABLE CHEST 1 VIEW COMPARISON:  Prior radiograph from 06/10/2018. FINDINGS: Endotracheal tube in place with tip position 7.5 cm above the carina at the level of the mid clavicles. Enteric tube in place, and appears to be malpositioned projecting over the right lower lung, likely in the distal right mainstem bronchus. Follow-up abdominal radiograph performed at 23:41 demonstrates the enteric tube appropriately position within the stomach.  Cardiomegaly.  Mediastinal silhouette normal. Lungs normally inflated. Findings consistent with moderate diffuse pulmonary edema. Probable trace right pleural effusion. No consolidative opacity. No pneumothorax. Defibrillator pad overlies the lower chest. No acute osseous finding. IMPRESSION: 1. Tip of endotracheal tube  positioned 7.5 cm above the carina. 2. Tip of enteric tube projecting over the lower right chest, likely in the distal right mainstem bronchus. At time of this dictation, a follow-up radiograph of the abdomen has already been performed demonstrating the enteric to appropriately position within the stomach, presumably this has already been repositioned. 3. Cardiomegaly with moderate diffuse pulmonary edema and trace right pleural effusion, suggesting CHF. Electronically Signed   By: Rise Mu M.D.   On: 07/31/2018 00:12   Dg Abd Portable 1 View  Result Date: 07/31/2018 CLINICAL DATA:  Initial evaluation for enteric tube placement. EXAM: PORTABLE ABDOMEN - 1 VIEW COMPARISON:  None. FINDINGS: Enteric tube in place with tip projecting over the distal stomach, side hole well beyond the GE junction at the level of the gastric body. Tip projects distally. Visualized bowel gas pattern is nonobstructive and within normal limits. IMPRESSION: Tip and side hole of enteric tube overlying the distal stomach, well beyond the GE junction. Electronically Signed   By: Rise Mu M.D.   On: 07/31/2018 00:13    Microbiology No results found for this or any previous visit (from the past 240 hour(s)).  Lab Basic Metabolic Panel: No results for input(s): NA, K, CL, CO2, GLUCOSE, BUN, CREATININE, CALCIUM, MG, PHOS in the last 168 hours. Liver Function Tests: No results for input(s): AST, ALT, ALKPHOS, BILITOT, PROT, ALBUMIN in the last 168 hours. No results for input(s): LIPASE, AMYLASE in the last 168 hours. No results for input(s): AMMONIA in the last 168 hours. CBC: No results for  input(s): WBC, NEUTROABS, HGB, HCT, MCV, PLT in the last 168 hours. Cardiac Enzymes: No results for input(s): CKTOTAL, CKMB, CKMBINDEX, TROPONINI in the last 168 hours. Sepsis Labs: No results for input(s): PROCALCITON, WBC, LATICACIDVEN in the last 168 hours.  Procedures/Operations     , 08/19/2018, 10:37 AM

## 2018-09-02 DEATH — deceased

## 2020-12-09 IMAGING — CT CT ANGIO CHEST
2 of 7 series · 18 of 46 positions shown · IV contrast (APPLIED)
Comparison: 07/23/2014 CT chest.

CLINICAL DATA: 77 y/o F; 77 y/o F; 6 weeks of shortness of breath.

EXAM:
CT ANGIOGRAPHY CHEST WITH CONTRAST
TECHNIQUE: Multidetector CT imaging of the chest was performed using the
standard protocol during bolus administration of intravenous
contrast. Multiplanar CT image reconstructions and MIPs were
obtained to evaluate the vascular anatomy.
CONTRAST:  63 cc Isovue 370

[Series 6: thins · axial · 0.62mm/px · z∈[-287,-53]mm · 15 of 377 slices shown]
[im 21/377  lung]
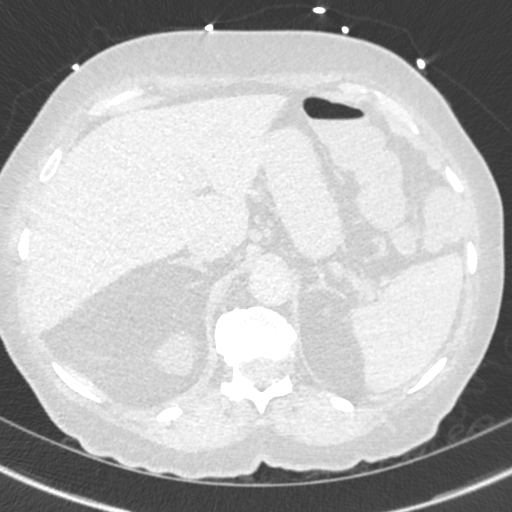
[im 42/377  soft-tissue]
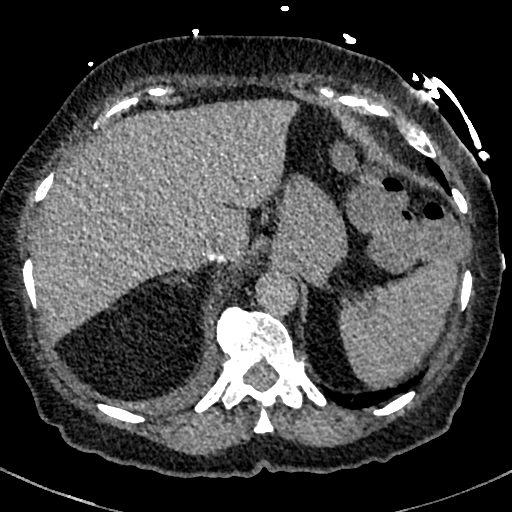
[im 63/377  lung]
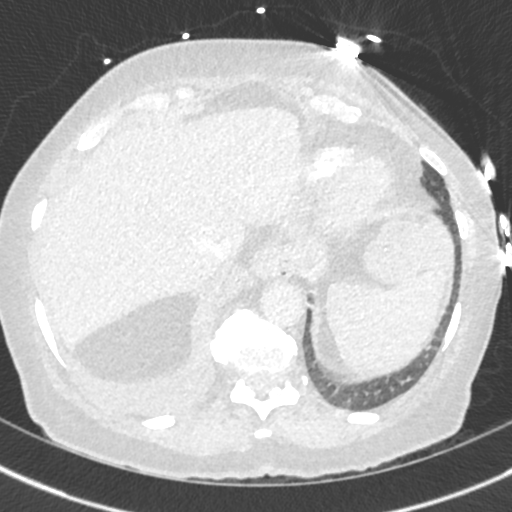
[im 84/377  soft-tissue]
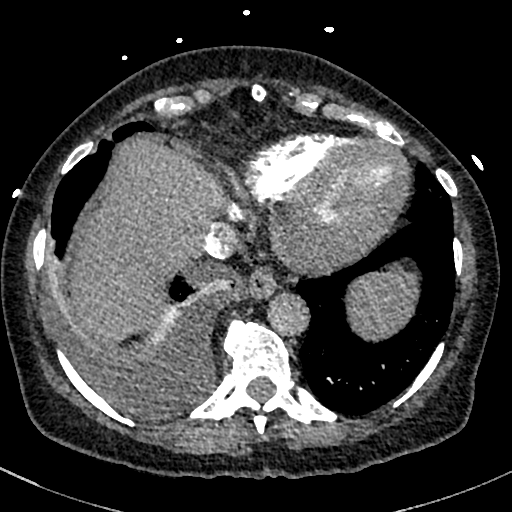
[im 126/377  lung]
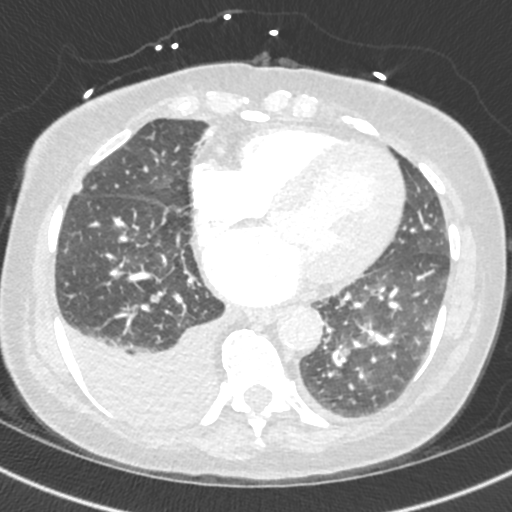
[im 147/377  soft-tissue]
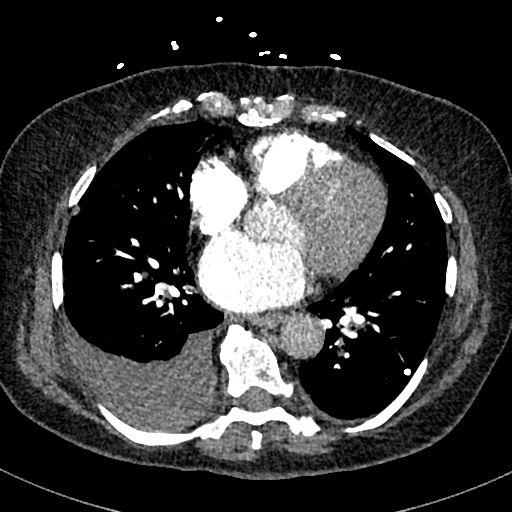
[im 168/377  lung]
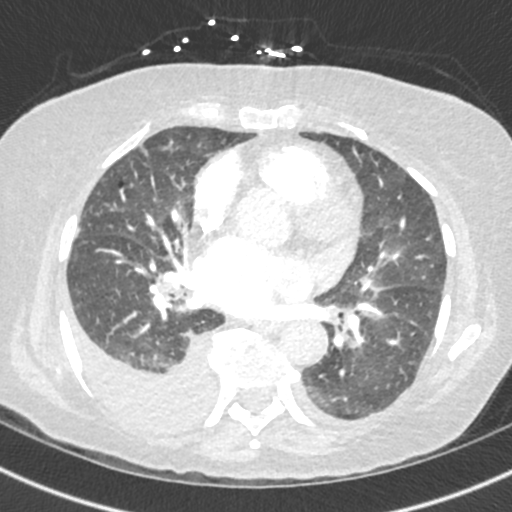
[im 189/377  soft-tissue]
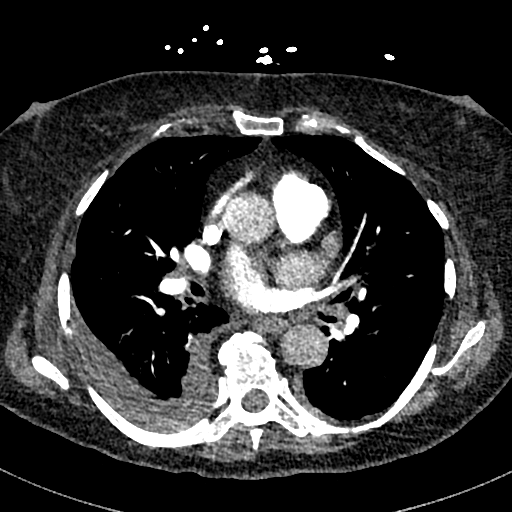
[im 209/377  lung]
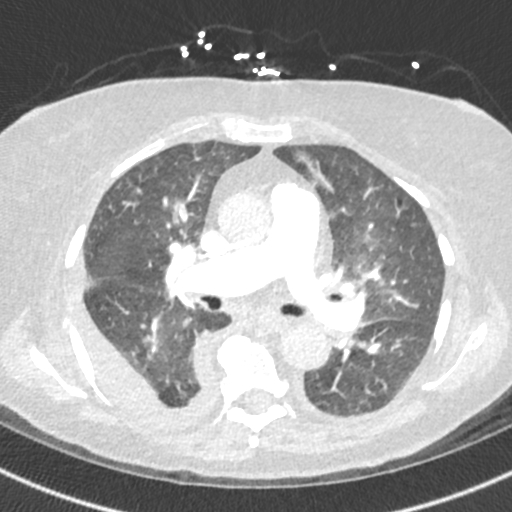
[im 230/377  soft-tissue]
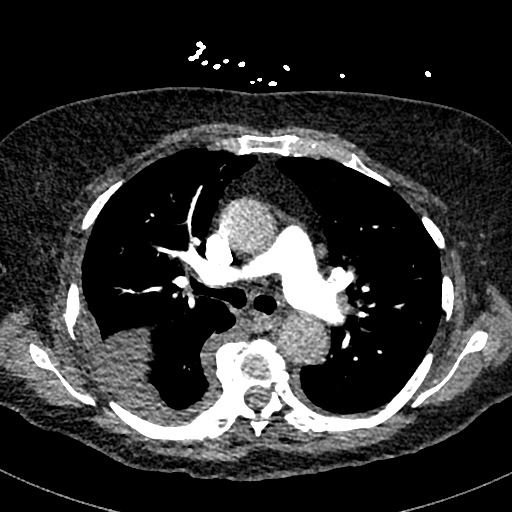
[im 251/377  lung]
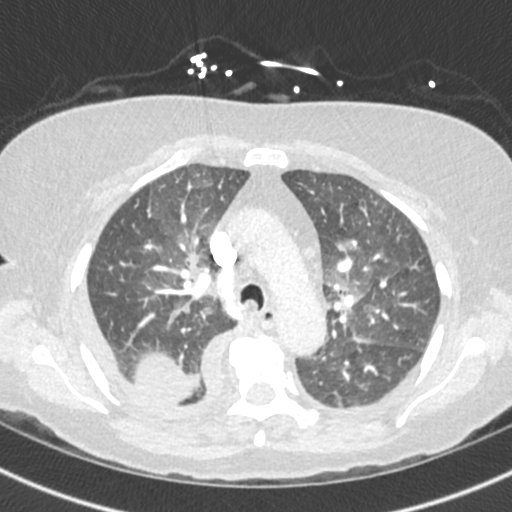
[im 293/377  soft-tissue]
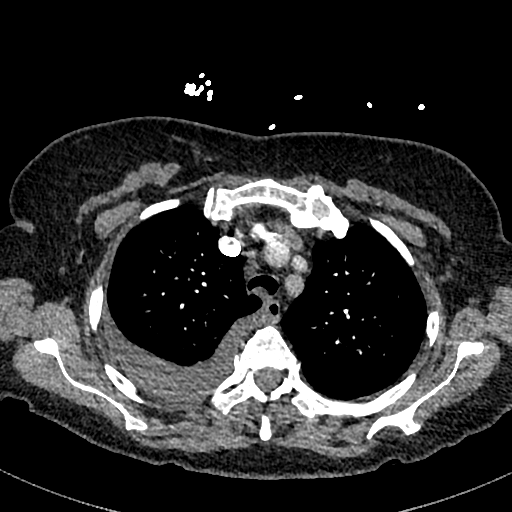
[im 314/377  lung]
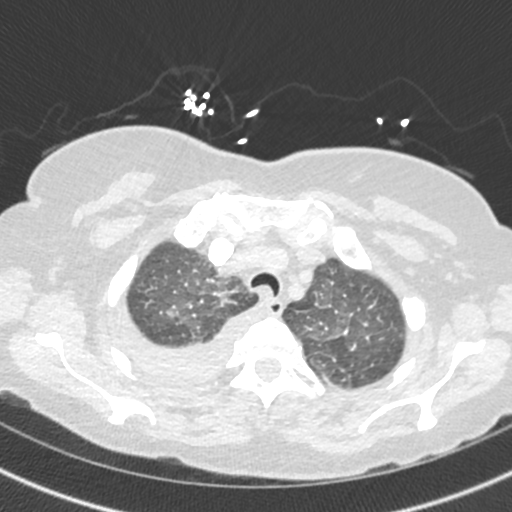
[im 335/377  soft-tissue]
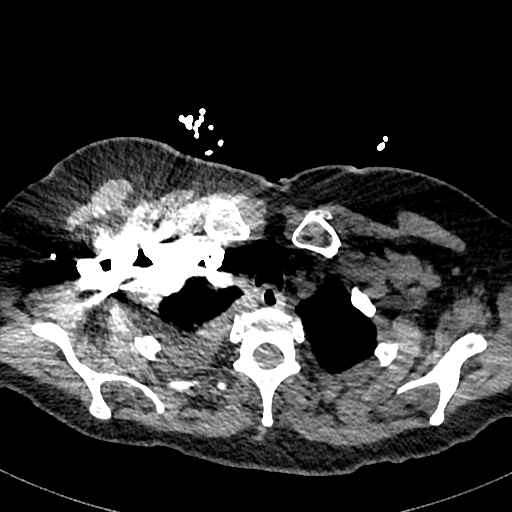
[im 356/377  lung]
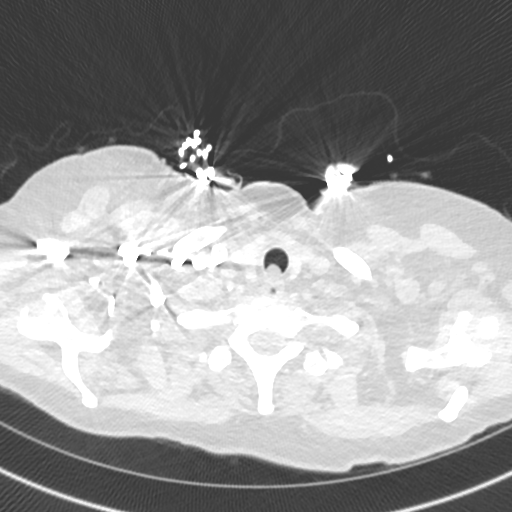

[Series 8: cor · coronal · 0.52mm/px · 3 of 130 slices shown]
[im 33/130  soft-tissue]
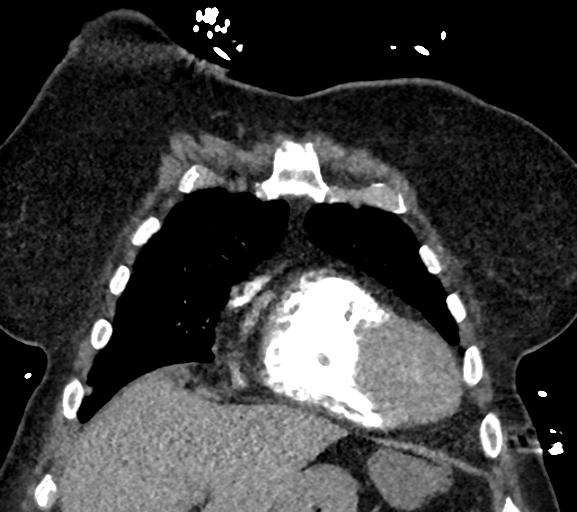
[im 65/130  soft-tissue]
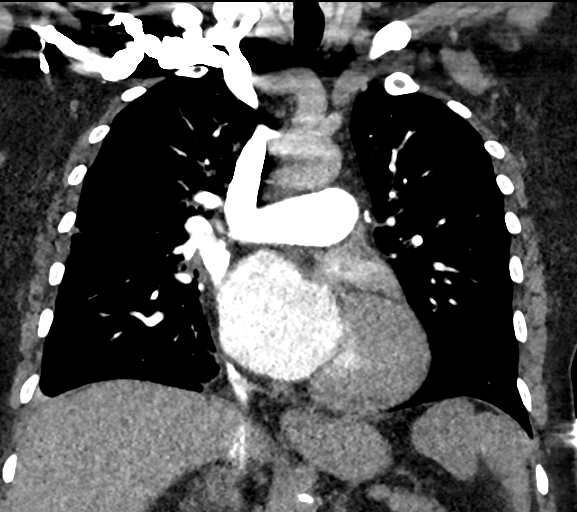
[im 97/130  soft-tissue]
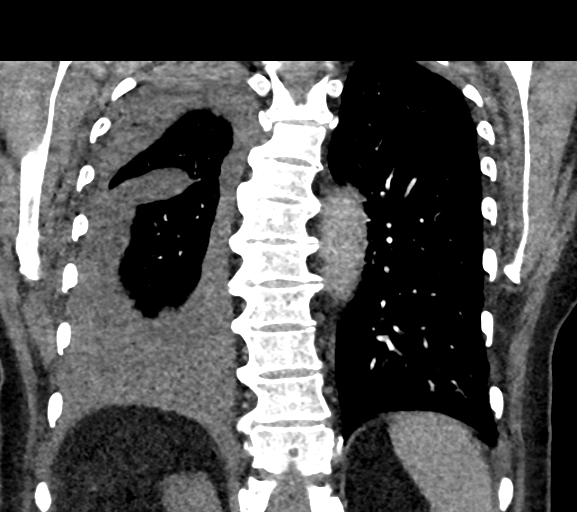

[18 of 46 positions shown; findings below may reference images not displayed]

FINDINGS: Cardiovascular: Mild aortic and coronary artery calcific
atherosclerosis. Normal caliber aorta and main pulmonary artery.
Mild cardiomegaly. No pericardial effusion. Satisfactory
opacification of the pulmonary arteries. No pulmonary embolus.

Mediastinum/Nodes: No enlarged mediastinal, hilar, or axillary lymph
nodes. Thyroid gland, trachea, and esophagus demonstrate no
significant findings.

Lungs/Pleura: Smooth interlobular septal thickening, central
ground-glass opacities, peribronchial thickening, moderate right
pleural effusion, small left pleural effusion. No consolidation or
pneumothorax.

Upper Abdomen: Partially visualized exophytic cyst of the right
kidney measuring up to 3.8 cm. Small hiatal hernia.

Musculoskeletal: Right 5-6 lateral rib mildly displaced fractures.

Review of the MIP images confirms the above findings.
IMPRESSION: 1. Interstitial and mild alveolar pulmonary edema. Moderate right
and small left pleural effusions.
2. Mild cardiomegaly.
3. Mild coronary artery calcific atherosclerosis and Aortic
Atherosclerosis ([id number]).
4. Acute to subacute right lateral mildly displaced 5 and 6 rib
fractures.
